# Patient Record
Sex: Male | Born: 1950 | Race: White | Hispanic: No | Marital: Single | Smoking: Light tobacco smoker
Health system: Southern US, Community
[De-identification: ages and names within clinical notes are randomized; demographics above are authoritative.]

## PROBLEM LIST (undated history)

## (undated) DIAGNOSIS — I10 Essential (primary) hypertension: Secondary | ICD-10-CM

## (undated) DIAGNOSIS — J811 Chronic pulmonary edema: Secondary | ICD-10-CM

## (undated) DIAGNOSIS — I6509 Occlusion and stenosis of unspecified vertebral artery: Secondary | ICD-10-CM

## (undated) DIAGNOSIS — E7800 Pure hypercholesterolemia, unspecified: Secondary | ICD-10-CM

## (undated) DIAGNOSIS — S060XAA Concussion with loss of consciousness status unknown, initial encounter: Secondary | ICD-10-CM

## (undated) DIAGNOSIS — M1990 Unspecified osteoarthritis, unspecified site: Secondary | ICD-10-CM

## (undated) DIAGNOSIS — S060X9A Concussion with loss of consciousness of unspecified duration, initial encounter: Secondary | ICD-10-CM

## (undated) HISTORY — PX: INCISION AND DRAINAGE OF WOUND: SHX1803

## (undated) HISTORY — PX: TONSILLECTOMY: SUR1361

## (undated) HISTORY — DX: Essential (primary) hypertension: I10

## (undated) HISTORY — DX: Unspecified osteoarthritis, unspecified site: M19.90

---

## 2005-08-09 HISTORY — PX: KNEE ARTHROSCOPY: SUR90

## 2010-03-27 ENCOUNTER — Emergency Department (HOSPITAL_BASED_OUTPATIENT_CLINIC_OR_DEPARTMENT_OTHER): Admission: EM | Admit: 2010-03-27 | Discharge: 2010-03-27 | Payer: Self-pay | Admitting: Emergency Medicine

## 2011-11-09 ENCOUNTER — Encounter: Payer: Self-pay | Admitting: Family Medicine

## 2011-11-09 ENCOUNTER — Ambulatory Visit (INDEPENDENT_AMBULATORY_CARE_PROVIDER_SITE_OTHER): Payer: 59 | Admitting: Family Medicine

## 2011-11-09 VITALS — BP 196/91 | HR 80 | Temp 98.1°F | Ht 74.0 in | Wt 248.0 lb

## 2011-11-09 DIAGNOSIS — K4090 Unilateral inguinal hernia, without obstruction or gangrene, not specified as recurrent: Secondary | ICD-10-CM

## 2011-11-09 DIAGNOSIS — K4020 Bilateral inguinal hernia, without obstruction or gangrene, not specified as recurrent: Secondary | ICD-10-CM | POA: Insufficient documentation

## 2011-11-09 NOTE — Progress Notes (Signed)
  Subjective:    Patient ID: Arthur Hines, male    DOB: 1951-03-25, 61 y.o.   MRN: [id number]  PCP: None listed  HPI 61 yo M here for low abdominal pain.  Patient states 3 months ago during a martial arts test he pulled something in groin area while doing knee thrusts. Was able to continue in martial arts but with pain. Since then has continued to have nagging intermittent pain in this area worse with physical activity. Pain radiates into right testicle. Associated with bulge he's noticed in abdominal wall. Seen a couple different physicians and a chiropractor - told he had a groin strain and another stated it was coming from his back. Has been taking xiflamed and resting the area (previously was doing home exercise program). Not doing any exercises currently. Notes he has white coat hypertension and is being followed by his PCP (did not list this on his sheet who the PCP was). No GI symptoms.  Past Medical History  Diagnosis Date  . Hypertension     No current outpatient prescriptions on file prior to visit.    History reviewed. No pertinent past surgical history.  No Known Allergies  History   Social History  . Marital Status: Single    Spouse Name: N/A    Number of Children: N/A  . Years of Education: N/A   Occupational History  . Not on file.   Social History Main Topics  . Smoking status: Current Some Day Smoker    Types: Pipe  . Smokeless tobacco: Not on file   Comment: smokes a pipe occasionally  . Alcohol Use: Not on file  . Drug Use: Not on file  . Sexually Active: Not on file   Other Topics Concern  . Not on file   Social History Narrative  . No narrative on file    Family History  Problem Relation Age of Onset  . Hypertension Mother   . Diabetes Neg Hx   . Heart attack Neg Hx   . Hyperlipidemia Neg Hx   . Sudden death Neg Hx     BP 196/91  Pulse 80  Temp(Src) 98.1 F (36.7 C) (Oral)  Ht 6\' 2"  (1.88 m)  Wt 248 lb (112.492 kg)  BMI  31.84 kg/m2  Review of Systems See HPI above.    Objective:   Physical Exam Gen: NAD  Abd/GU: Soft, nontender, nondistended. Palpable defect right abdominal wall with valsalva consistent with location of direct inguinal hernia. Hernia also felt in inguinal canal on right, not on left. No other abnormalities.    Assessment & Plan:  1. Right direct inguinal hernia - possible indirect component as well.  Persistent pain impacting his ability to exercise.  Will refer to general surgeon to further discuss treatment, operative intervention, recovery time, etc.

## 2011-11-09 NOTE — Assessment & Plan Note (Signed)
possible indirect component as well.  Persistent pain impacting his ability to exercise.  Will refer to general surgeon to further discuss treatment, operative intervention, recovery time, etc.

## 2011-11-24 ENCOUNTER — Ambulatory Visit (INDEPENDENT_AMBULATORY_CARE_PROVIDER_SITE_OTHER): Payer: 59 | Admitting: Surgery

## 2011-11-24 ENCOUNTER — Encounter (INDEPENDENT_AMBULATORY_CARE_PROVIDER_SITE_OTHER): Payer: Self-pay | Admitting: Surgery

## 2011-11-24 VITALS — BP 188/92 | HR 84 | Temp 98.4°F | Resp 16 | Ht 73.0 in | Wt 249.0 lb

## 2011-11-24 DIAGNOSIS — K4090 Unilateral inguinal hernia, without obstruction or gangrene, not specified as recurrent: Secondary | ICD-10-CM

## 2011-11-24 NOTE — Progress Notes (Signed)
Subjective:     Patient ID: Arthur Hines, male   DOB: [birth year], 61 y.o.   MRN: [id number]  HPI  Arthur Hines  1951/05/05 [id number]  Patient Care Team: Provider Not In System as PCP - General Lenda Kelp, MD as Attending Physician (Family Medicine)  This patient is a 61 y.o.male who presents today for surgical evaluation at the request of Dr.Hudnall.   Reason for visit: Right groin swelling and pain. Probable inguinal hernia.  Patient is an active male. Does martial arts. Walks daily. He is originally from [name]. He works for [employer]. He comes to the [place] region for periods of time.  On 15December after a working on an intense martial arts workout, he felt right groin pain. Initial evaluations in [place] and here was consistent with a groin strain. However, he's noticed a bulge in the past month. He was concerned. Dr. Pearletha Forge was concerned about an inguinal hernia. Surgery consultation was requested.  No history of skin infections. Rather active but now has to reduce to low impact activity.  He wants to get back to a more intense physical regimen to keep his weight down. No history of prior hernia or abdominal repairs/surgeries. No urinary problems. Mild low back pain.  His chiropractor helps take care of that. He is working to keep his weight down. He is trying to figure it were to establish his primary care physician. He's plan to get a colonoscopy this year.  Patient Active Problem List  Diagnoses  . Right inguinal hernia    Past Medical History  Diagnosis Date  . Hypertension   . Arthritis     Past Surgical History  Procedure Date  . Meniscus repair 2007    left knee    History   Social History  . Marital Status: Single    Spouse Name: N/A    Number of Children: N/A  . Years of Education: N/A   Occupational History  . Not on file.   Social History Main Topics  . Smoking status: Former Smoker    Types: Pipe  . Smokeless tobacco: Not on  file   Comment: smokes a pipe occasionally  . Alcohol Use: Yes  . Drug Use: No  . Sexually Active: Not on file   Other Topics Concern  . Not on file   Social History Narrative  . No narrative on file    Family History  Problem Relation Age of Onset  . Hypertension Mother   . Diabetes Neg Hx   . Heart attack Neg Hx   . Hyperlipidemia Neg Hx   . Sudden death Neg Hx     Current Outpatient Prescriptions  Medication Sig Dispense Refill  . Cholecalciferol (VITAMIN D3 PO) Take by mouth daily.      . fish oil-omega-3 fatty acids 1000 MG capsule Take 2 g by mouth daily.         No Known Allergies  BP 188/92  Pulse 84  Temp(Src) 98.4 F (36.9 C) (Temporal)  Resp 16  Ht 6\' 1"  (1.854 m)  Wt 249 lb (112.946 kg)  BMI 32.85 kg/m2     Review of Systems  Constitutional: Negative for fever, chills and diaphoresis.  HENT: Negative for nosebleeds, sore throat, facial swelling, mouth sores, trouble swallowing and ear discharge.   Eyes: Negative for photophobia, discharge and visual disturbance.  Respiratory: Negative for choking, chest tightness, shortness of breath and stridor.   Cardiovascular: Negative for chest pain and palpitations.  Patient walks 60 minutes for about 1.5 miles daily without difficulty.  No exertional chest/neck/shoulder/arm pain.   Gastrointestinal: Negative for nausea, vomiting, abdominal pain, diarrhea, constipation, blood in stool, abdominal distention, anal bleeding and rectal pain.  Genitourinary: Negative for dysuria, urgency, difficulty urinating and testicular pain.  Musculoskeletal: Positive for back pain. Negative for myalgias, arthralgias and gait problem.  Skin: Negative for color change, pallor, rash and wound.  Neurological: Negative for dizziness, speech difficulty, weakness, numbness and headaches.  Hematological: Negative for adenopathy. Does not bruise/bleed easily.  Psychiatric/Behavioral: Negative for hallucinations, confusion and  agitation.       Objective:   Physical Exam  Constitutional: He is oriented to person, place, and time. He appears well-developed and well-nourished. No distress.  HENT:  Head: Normocephalic.  Mouth/Throat: Oropharynx is clear and moist. No oropharyngeal exudate.  Eyes: Conjunctivae and EOM are normal. Pupils are equal, round, and reactive to light. No scleral icterus.  Neck: Normal range of motion. Neck supple. No tracheal deviation present.  Cardiovascular: Normal rate, regular rhythm and intact distal pulses.   Pulmonary/Chest: Effort normal and breath sounds normal. No respiratory distress.  Abdominal: Soft. He exhibits no distension and no mass. There is no tenderness. There is no guarding. A hernia is present. Hernia confirmed positive in the right inguinal area. Hernia confirmed negative in the left inguinal area.  Genitourinary:     Musculoskeletal: Normal range of motion. He exhibits no tenderness.  Lymphadenopathy:    He has no cervical adenopathy.       Right: No inguinal adenopathy present.       Left: No inguinal adenopathy present.  Neurological: He is alert and oriented to person, place, and time. No cranial nerve deficit. He exhibits normal muscle tone. Coordination normal.  Skin: Skin is warm and dry. No rash noted. He is not diaphoretic. No erythema. No pallor.  Psychiatric: He has a normal mood and affect. His behavior is normal. Judgment and thought content normal.       Assessment:     RIH      Plan:     So far, he has only evidence of hernia on the right side. If I find one on the left, I will fix it as well, especially given his intense physical activity. There is no evidence on physical exam today.  The anatomy & physiology of the abdominal wall and pelvic floor was discussed.  The pathophysiology of hernias in the inguinal and pelvic region was discussed.  Natural history risks such as progressive enlargement, pain, incarceration & strangulation was  discussed.   Contributors to complications such as smoking, obesity, diabetes, prior surgery, etc were discussed.    I feel the risks of no intervention will lead to serious problems that outweigh the operative risks; therefore, I recommended surgery to reduce and repair the hernia.  I explained laparoscopic techniques with possible need for an open approach.  I noted usual use of mesh to patch and/or buttress hernia repair  Risks such as bleeding, infection, abscess, need for further treatment, heart attack, death, and other risks were discussed.  I noted a good likelihood this will help address the problem.   Goals of post-operative recovery were discussed as well.  Possibility that this will not correct all symptoms was explained.  I stressed the importance of low-impact activity, aggressive pain control, avoiding constipation, & not pushing through pain to minimize risk of post-operative chronic pain or injury. Possibility of reherniation was discussed.  We will work  to minimize complications.     An educational handout further explaining the pathology & treatment options was given as well.  Questions were answered.  The patient expresses understanding & wishes to proceed with surgery.

## 2011-11-24 NOTE — Patient Instructions (Signed)
Hernia Repair with Laparoscope A hernia occurs when an internal organ pushes out through a weak spot in the belly (abdominal) wall muscles. Hernias most commonly occur in the groin and around the navel. Hernias can also occur through a cut by the surgeon (incision) after an abdominal operation. A hernia may be caused by:  Lifting heavy objects.   Prolonged coughing.   Straining to move your bowels.  Hernias can often be pushed back into place (reduced). Most hernias tend to get worse over time. Problems occur when abdominal contents get stuck in the opening and the blood supply is blocked or impaired (incarcerated hernia). Because of these risks, you require surgery to repair the hernia. Your hernia will be repaired using a laparoscope. Laparoscopic surgery is a type of minimally invasive surgery. It does not involve making a typical surgical cut (incision) in the skin. A laparoscope is a telescope-like rod and lens system. It is usually connected to a video camera and a light source so your caregiver can clearly see the operative area. The instruments are inserted through  to  inch (5 mm or 10 mm) openings in the skin at specific locations. A working and viewing space is created by blowing a small amount of carbon dioxide gas into the abdominal cavity. The abdomen is essentially blown up like a balloon (insufflated). This elevates the abdominal wall above the internal organs like a dome. The carbon dioxide gas is common to the human body and can be absorbed by tissue and removed by the respiratory system. Once the repair is completed, the small incisions will be closed with either stitches (sutures) or staples (just like a paper stapler only this staple holds the skin together). LET YOUR CAREGIVERS KNOW ABOUT:  Allergies.   Medications taken including herbs, eye drops, over the counter medications, and creams.   Use of steroids (by mouth or creams).   Previous problems with anesthetics or  Novocaine.   Possibility of pregnancy, if this applies.   History of blood clots (thrombophlebitis).   History of bleeding or blood problems.   Previous surgery.   Other health problems.  BEFORE THE PROCEDURE  Laparoscopy can be done either in a hospital or out-patient clinic. You may be given a mild sedative to help you relax before the procedure. Once in the operating room, you will be given a general anesthesia to make you sleep (unless you and your caregiver choose a different anesthetic).  AFTER THE PROCEDURE  After the procedure you will be watched in a recovery area. Depending on what type of hernia was repaired, you might be admitted to the hospital or you might go home the same day. With this procedure you may have less pain and scarring. This usually results in a quicker recovery and less risk of infection. HOME CARE INSTRUCTIONS   Bed rest is not required. You may continue your normal activities but avoid heavy lifting (more than 10 pounds) or straining.   Cough gently. If you are a smoker it is best to stop, as even the best hernia repair can break down with the continual strain of coughing.   Avoid driving until given the OK by your surgeon.   There are no dietary restrictions unless given otherwise.   TAKE ALL MEDICATIONS AS DIRECTED.   Only take over-the-counter or prescription medicines for pain, discomfort, or fever as directed by your caregiver.  SEEK MEDICAL CARE IF:   There is increasing abdominal pain or pain in your incisions.  There is more bleeding from incisions, other than minimal spotting.   You feel light headed or faint.   You develop an unexplained fever, chills, and/or an oral temperature above 102 F (38.9 C).   You have redness, swelling, or increasing pain in the wound.   Pus coming from wound.   A foul smell coming from the wound or dressings.  SEEK IMMEDIATE MEDICAL CARE IF:   You develop a rash.   You have difficulty breathing.    You have any allergic problems.  MAKE SURE YOU:   Understand these instructions.   Will watch your condition.   Will get help right away if you are not doing well or get worse.  Document Released: 07/26/2005 Document Revised: 07/15/2011 Document Reviewed: 06/25/2009 [facility] Patient Information [address], [place].  Managing Pain  Pain after surgery or related to activity is often due to strain/injury to muscle, tendon, nerves and/or incisions.  This pain is usually short-term and will improve in a few months.   Many people find it helpful to do the following things TOGETHER to help speed the process of healing and to get back to regular activity more quickly:  1. Avoid heavy physical activity a.  no lifting greater than 20 pounds b. Do not "push through" the pain.  Listen to your body and avoid positions and maneuvers than reproduce the pain c. Walking is okay as tolerated, but go slowly and stop when getting sore.  d. Remember: If it hurts to do it, then don't do it! 2. Take Anti-inflammatory medication  a. Take with food/snack around the clock for 1-2 weeks i. This helps the muscle and nerve tissues become less irritable and calm down faster b. Choose ONE of the following over-the-counter medications: i. Naproxen 220mg  tabs (ex. Aleve) 1-2 pills twice a day  ii. Ibuprofen 200mg  tabs (ex. Advil, Motrin) 3-4 pills with every meal and just before bedtime iii. Acetaminophen 500mg  tabs (Tylenol) 1-2 pills with every meal and just before bedtime 3. Use a Heating pad or Ice/Cold Pack a. 4-6 times a day b. May use warm bath/hottub  or showers 4. Try Gentle Massage and/or Stretching  a. at the area of pain many times a day b. stop if you feel pain - do not overdo it  Try these steps together to help you body heal faster and avoid making things get worse.  Doing just one of these things may not be enough.    If you are not getting better after two weeks or are noticing you are  getting worse, contact our office for further advice; we may need to re-evaluate you & see what other things we can do to help.

## 2011-11-25 ENCOUNTER — Encounter (INDEPENDENT_AMBULATORY_CARE_PROVIDER_SITE_OTHER): Payer: Self-pay

## 2011-11-26 ENCOUNTER — Ambulatory Visit (INDEPENDENT_AMBULATORY_CARE_PROVIDER_SITE_OTHER): Payer: 59 | Admitting: Surgery

## 2011-12-13 DIAGNOSIS — K4020 Bilateral inguinal hernia, without obstruction or gangrene, not specified as recurrent: Secondary | ICD-10-CM

## 2011-12-13 HISTORY — PX: INGUINAL HERNIA REPAIR: SUR1180

## 2011-12-27 ENCOUNTER — Ambulatory Visit (INDEPENDENT_AMBULATORY_CARE_PROVIDER_SITE_OTHER): Payer: 59 | Admitting: Surgery

## 2011-12-27 ENCOUNTER — Encounter (INDEPENDENT_AMBULATORY_CARE_PROVIDER_SITE_OTHER): Payer: Self-pay | Admitting: Surgery

## 2011-12-27 VITALS — BP 196/100 | HR 88 | Temp 98.0°F | Resp 16 | Ht 74.0 in | Wt 252.4 lb

## 2011-12-27 DIAGNOSIS — K4020 Bilateral inguinal hernia, without obstruction or gangrene, not specified as recurrent: Secondary | ICD-10-CM

## 2011-12-27 NOTE — Progress Notes (Signed)
Subjective:     Patient ID: Arthur Hines, male   DOB: 12-19-1950, 61 y.o.   MRN: [id number]  HPI  Tracker Mance  01/28/1951 [id number]  Patient Care Team: Provider Not In System as PCP - General Lenda Kelp, MD as Attending Physician (Family Medicine)  This patient is a 61 y.o.male who presents today for surgical evaluation.    Procedure: Laparoscopic bilateral inguinal hernia repairs  Reason for visit: Followup  The patient comes in today doing pretty well. We weaned himself off the narcotics around postoperative day #10. Using ibuprofen twice a day. Urinating fine. Moving his bowels well.  He had a moderate amount of the bruising and swelling. Those are gradually improving although he feels the spermatic cord rather full. Some sensitivity at the incisions, but it is mild. He is back to walking and being more active. Hoping to go back to work on light duty soon. No fevers or chills.  Patient Active Problem List  Diagnoses  . Bilateral inguinal hernia (BIH)    Past Medical History  Diagnosis Date  . Hypertension   . Arthritis     Past Surgical History  Procedure Date  . Meniscus repair 2007    left knee  . Hernia repair 12/13/2011    RIH    History   Social History  . Marital Status: Single    Spouse Name: N/A    Number of Children: N/A  . Years of Education: N/A   Occupational History  . Not on file.   Social History Main Topics  . Smoking status: Former Smoker    Types: Pipe  . Smokeless tobacco: Not on file   Comment: smokes a pipe occasionally  . Alcohol Use: Yes  . Drug Use: No  . Sexually Active: Not on file   Other Topics Concern  . Not on file   Social History Narrative  . No narrative on file    Family History  Problem Relation Age of Onset  . Hypertension Mother   . Diabetes Neg Hx   . Heart attack Neg Hx   . Hyperlipidemia Neg Hx   . Sudden death Neg Hx     Current Outpatient Prescriptions  Medication Sig Dispense Refill  .  Cholecalciferol (VITAMIN D3 PO) Take by mouth daily.      . fish oil-omega-3 fatty acids 1000 MG capsule Take 2 g by mouth daily.         No Known Allergies  BP 196/100  Pulse 88  Temp(Src) 98 F (36.7 C) (Temporal)  Resp 16  Ht 6\' 2"  (1.88 m)  Wt 252 lb 6.4 oz (114.488 kg)  BMI 32.41 kg/m2  No results found.   Review of Systems  Constitutional: Negative for fever, chills and diaphoresis.  HENT: Negative for sore throat, trouble swallowing and neck pain.   Eyes: Negative for photophobia and visual disturbance.  Respiratory: Negative for choking and shortness of breath.   Cardiovascular: Negative for chest pain and palpitations.  Gastrointestinal: Negative for nausea, vomiting, abdominal distention, anal bleeding and rectal pain.  Genitourinary: Negative for dysuria, urgency, difficulty urinating and testicular pain.  Musculoskeletal: Negative for myalgias, arthralgias and gait problem.  Skin: Negative for color change and rash.  Neurological: Negative for dizziness, speech difficulty, weakness and numbness.  Hematological: Negative for adenopathy.  Psychiatric/Behavioral: Negative for hallucinations, confusion and agitation.       Objective:   Physical Exam  Constitutional: He is oriented to person, place, and time. He appears well-developed  and well-nourished. No distress.  HENT:  Head: Normocephalic.  Mouth/Throat: Oropharynx is clear and moist. No oropharyngeal exudate.  Eyes: Conjunctivae and EOM are normal. Pupils are equal, round, and reactive to light. No scleral icterus.  Neck: Normal range of motion. No tracheal deviation present.  Cardiovascular: Normal rate, normal heart sounds and intact distal pulses.   Pulmonary/Chest: Effort normal. No respiratory distress.  Abdominal: Soft. He exhibits no distension. There is no tenderness. Hernia confirmed negative in the right inguinal area and confirmed negative in the left inguinal area.       Incisions clean with  normal healing ridges.  No hernias  Genitourinary:       Mod thickening of spermatic cords but viable.  Scrotum without major hematoma  Musculoskeletal: Normal range of motion. He exhibits no tenderness.  Neurological: He is alert and oriented to person, place, and time. No cranial nerve deficit. He exhibits normal muscle tone. Coordination normal.  Skin: Skin is warm and dry. No rash noted. He is not diaphoretic.  Psychiatric: He has a normal mood and affect. His behavior is normal.       Assessment:     2 weeks s/p lap BIH repairs, recovering    Plan:     Increase activity as tolerated.  Do not push through pain.  Advanced on diet as tolerated. Bowel regimen to avoid problems.  Okay to start light duty for a few weeks. Gradually transition to unrestricted activity over the next month  Return to clinic p.r.n. The patient expressed understanding and appreciation

## 2012-09-09 DIAGNOSIS — I6509 Occlusion and stenosis of unspecified vertebral artery: Secondary | ICD-10-CM

## 2012-09-09 HISTORY — DX: Occlusion and stenosis of unspecified vertebral artery: I65.09

## 2012-09-29 ENCOUNTER — Encounter (HOSPITAL_COMMUNITY): Payer: Self-pay | Admitting: Emergency Medicine

## 2012-09-29 ENCOUNTER — Inpatient Hospital Stay (HOSPITAL_COMMUNITY)
Admission: EM | Admit: 2012-09-29 | Discharge: 2012-09-30 | DRG: 149 | Disposition: A | Discharge status: Home / Self Care | Payer: 59 | Attending: Internal Medicine | Admitting: Internal Medicine

## 2012-09-29 ENCOUNTER — Emergency Department (HOSPITAL_COMMUNITY): Payer: 59

## 2012-09-29 ENCOUNTER — Inpatient Hospital Stay (HOSPITAL_COMMUNITY): Payer: 59

## 2012-09-29 DIAGNOSIS — I2510 Atherosclerotic heart disease of native coronary artery without angina pectoris: Secondary | ICD-10-CM | POA: Diagnosis present

## 2012-09-29 DIAGNOSIS — G459 Transient cerebral ischemic attack, unspecified: Secondary | ICD-10-CM

## 2012-09-29 DIAGNOSIS — R42 Dizziness and giddiness: Principal | ICD-10-CM | POA: Diagnosis present

## 2012-09-29 DIAGNOSIS — R0989 Other specified symptoms and signs involving the circulatory and respiratory systems: Secondary | ICD-10-CM

## 2012-09-29 DIAGNOSIS — I658 Occlusion and stenosis of other precerebral arteries: Secondary | ICD-10-CM | POA: Diagnosis present

## 2012-09-29 DIAGNOSIS — Z79899 Other long term (current) drug therapy: Secondary | ICD-10-CM

## 2012-09-29 DIAGNOSIS — R0789 Other chest pain: Secondary | ICD-10-CM

## 2012-09-29 DIAGNOSIS — Z7982 Long term (current) use of aspirin: Secondary | ICD-10-CM

## 2012-09-29 DIAGNOSIS — I509 Heart failure, unspecified: Secondary | ICD-10-CM | POA: Diagnosis present

## 2012-09-29 DIAGNOSIS — M129 Arthropathy, unspecified: Secondary | ICD-10-CM | POA: Diagnosis present

## 2012-09-29 DIAGNOSIS — I5030 Unspecified diastolic (congestive) heart failure: Secondary | ICD-10-CM | POA: Diagnosis present

## 2012-09-29 DIAGNOSIS — I6529 Occlusion and stenosis of unspecified carotid artery: Secondary | ICD-10-CM | POA: Diagnosis present

## 2012-09-29 DIAGNOSIS — I10 Essential (primary) hypertension: Secondary | ICD-10-CM | POA: Diagnosis present

## 2012-09-29 DIAGNOSIS — F17200 Nicotine dependence, unspecified, uncomplicated: Secondary | ICD-10-CM | POA: Diagnosis present

## 2012-09-29 LAB — CREATININE, SERUM: GFR calc Af Amer: >90 mL/min (ref >90)

## 2012-09-29 LAB — CBC
Platelets: 171 10*3/uL (ref 150–400)
RBC: 4.65 MIL/uL (ref 4.22–5.81)
WBC: 8.6 10*3/uL (ref 4.0–10.5)

## 2012-09-29 LAB — TROPONIN I: Troponin I: <0.3 ng/mL (ref <0.30)

## 2012-09-29 LAB — BASIC METABOLIC PANEL
CO2: 24 mEq/L (ref 19–32)
Calcium: 8.9 mg/dL (ref 8.4–10.5)
Chloride: 102 mEq/L (ref 96–112)
Sodium: 138 mEq/L (ref 135–145)

## 2012-09-29 LAB — GLUCOSE, CAPILLARY

## 2012-09-29 LAB — RAPID URINE DRUG SCREEN, HOSP PERFORMED
Barbiturates: NOT DETECTED
Cocaine: NOT DETECTED
Tetrahydrocannabinol: NOT DETECTED

## 2012-09-29 MED ORDER — ATENOLOL 50 MG PO TABS
50.0000 mg | ORAL_TABLET | Freq: Every day | ORAL | Status: DC
  Administered 2012-09-29 – 2012-09-30 (×2): 50 mg via ORAL
  Filled 2012-09-29 (×2): qty 1

## 2012-09-29 MED ORDER — LISINOPRIL 10 MG PO TABS
30.0000 mg | ORAL_TABLET | Freq: Every evening | ORAL | Status: DC
Start: 2012-09-29 — End: 2012-09-30
  Administered 2012-09-29: 30 mg via ORAL
  Filled 2012-09-29 (×2): qty 1

## 2012-09-29 MED ORDER — GADOBENATE DIMEGLUMINE 529 MG/ML IV SOLN
20.0000 mL | Freq: Once | INTRAVENOUS | Status: AC
  Administered 2012-09-29: 20 mL via INTRAVENOUS

## 2012-09-29 MED ORDER — CLOPIDOGREL BISULFATE 75 MG PO TABS
75.0000 mg | ORAL_TABLET | Freq: Every day | ORAL | Status: DC
Start: 2012-09-30 — End: 2012-09-30
  Administered 2012-09-30: 75 mg via ORAL
  Filled 2012-09-29: qty 1

## 2012-09-29 MED ORDER — OMEGA-3-ACID ETHYL ESTERS 1 G PO CAPS
2.0000 g | ORAL_CAPSULE | Freq: Every day | ORAL | Status: DC
  Administered 2012-09-29 – 2012-09-30 (×2): 2 g via ORAL
  Filled 2012-09-29 (×2): qty 2

## 2012-09-29 MED ORDER — ASPIRIN 81 MG PO CHEW
324.0000 mg | CHEWABLE_TABLET | Freq: Once | ORAL | Status: AC
  Administered 2012-09-29: 324 mg via ORAL
  Filled 2012-09-29: qty 4

## 2012-09-29 MED ORDER — OMEGA-3 FATTY ACIDS 1000 MG PO CAPS: 2.0000 g | ORAL_CAPSULE | Freq: Every morning | ORAL | Status: DC

## 2012-09-29 MED ORDER — ENOXAPARIN SODIUM 40 MG/0.4ML ~~LOC~~ SOLN
40.0000 mg | Freq: Every day | SUBCUTANEOUS | Status: DC
  Administered 2012-09-29: 40 mg via SUBCUTANEOUS
  Filled 2012-09-29 (×2): qty 0.4

## 2012-09-29 MED ORDER — ASPIRIN EC 81 MG PO TBEC
81.0000 mg | DELAYED_RELEASE_TABLET | Freq: Every evening | ORAL | Status: DC
  Filled 2012-09-29: qty 1

## 2012-09-29 MED ORDER — HYDRALAZINE HCL 20 MG/ML IJ SOLN: 5.0000 mg | Freq: Four times a day (QID) | INTRAMUSCULAR | Status: DC | PRN

## 2012-09-29 NOTE — Progress Notes (Signed)
  Echocardiogram 2D Echocardiogram has been performed.  Jorje Guild 09/29/2012, 9:46 AM

## 2012-09-29 NOTE — Progress Notes (Signed)
*  PRELIMINARY RESULTS* Vascular Ultrasound Carotid Duplex (Doppler) has been completed.  Preliminary findings: Right:  40-59% internal carotid artery stenosis.  Left:  No evidence of hemodynamically significant internal carotid artery stenosis.   Antegrade vertebral flow.    Farrel Demark, RDMS, RVT  09/29/2012, 3:38 PM

## 2012-09-29 NOTE — Evaluation (Signed)
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  He has been exercising regularly with walking and doing Yoga with no exertional chest pain.  He has no shortness of breath, nausea, vomiting and diaphoresis.  He has "dizziness" with turning his head to the right.  He just has not been able to describe whether it is vertiginous or lightheadedness with his complaint of dizziness.  Evaluation in the ER showed normal CXR, unremarkable EKG, and negative troponin.  Hospitalist was asked to admit him for further work up.   Assessment / Plan / Recommendation Clinical Impression  Pt with normal communication/cognition/speech; no dysphagia.  MRI negative.  No SLP f/u warranted.  May benefit from vestibular assessment per PT/OT.    SLP Assessment  Patient does not need any further Speech Language Pathology Services     Arthur Hines Arthur Hines 09/29/2012, 1:00 PM

## 2012-09-29 NOTE — Progress Notes (Signed)
Utilization review completed.  

## 2012-09-29 NOTE — ED Notes (Signed)
MD at bedside. 

## 2012-09-29 NOTE — H&P (Signed)
[facility] History and Physical  Arthur Hines ZOX:[id number] DOB: 12/26/1950    PCP:   NONE  Chief Complaint: dizziness and chest pain.  HPI: Arthur Hines is an 62 y.o. 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  He has been exercising regularly with walking and doing Yoga with no exertional chest pain.  He has no shortness of breath, nausea, vomiting and diaphoresis.  He has "dizziness" with turning his head to the right.  He just has not been able to describe whether it is vertiginous or lightheadedness with his complaint of dizziness.  Evaluation in the ER showed normal CXR, unremarkable EKG, and negative troponin.  Hospitalist was asked to admit him for further work up.  Rewiew of Systems:  Constitutional: Negative for malaise, fever and chills. No significant weight loss or weight gain Eyes: Negative for eye pain, redness and discharge, diplopia, visual changes, or flashes of light. ENMT: Negative for ear pain, hoarseness, nasal congestion, sinus pressure and sore throat. No headaches; tinnitus, drooling, or problem swallowing. Cardiovascular: Negative for chest pain, palpitations, diaphoresis, dyspnea and peripheral edema. ; No orthopnea, PND Respiratory: Negative for cough, hemoptysis, wheezing and stridor. No pleuritic chestpain. Gastrointestinal: Negative for nausea, vomiting, diarrhea, constipation, abdominal pain, melena, blood in stool, hematemesis, jaundice and rectal bleeding.    Genitourinary: Negative for frequency, dysuria, incontinence,flank pain and hematuria; Musculoskeletal: Negative for back pain and neck pain. Negative for swelling and trauma.;  Skin: . Negative for pruritus, rash, abrasions, bruising and skin lesion.; ulcerations Neuro: Negative for stiffness. Negative for weakness, altered level of consciousness , altered mental status, extremity weakness,  burning feet, involuntary movement, seizure and syncope.  Psych: negative for anxiety, depression, insomnia, tearfulness, panic attacks, hallucinations, paranoia, suicidal or homicidal ideation    Past Medical History  Diagnosis Date  . Hypertension   . Arthritis     Past Surgical History  Procedure Laterality Date  . Meniscus repair  2007    left knee  . Hernia repair  12/13/2011    [facility]  . Hand surgery      Medications:  HOME MEDS: Prior to Admission medications   Medication Sig Start Date End Date Taking? Authorizing Provider  aspirin EC 81 MG tablet Take 81 mg by mouth every evening.   Yes Historical Provider, MD  CHLOROPHYLL PO Take 1 tablet by mouth every morning.   Yes Historical Provider, MD  Cholecalciferol (VITAMIN D3 PO) Take 1 capsule by mouth every morning.    Yes Historical Provider, MD  fish oil-omega-3 fatty acids 1000 MG capsule Take 2 g by mouth every morning.    Yes Historical Provider, MD  lisinopril (PRINIVIL,ZESTRIL) 30 MG tablet Take 30 mg by mouth every evening.    Yes Historical Provider, MD  MAGNESIUM-POTASSIUM PO Take 1 tablet by mouth every morning.   Yes Historical Provider, MD  Multiple Vitamin (MULTIVITAMIN WITH MINERALS) TABS Take 1 tablet by mouth every morning.   Yes Historical Provider, MD  OVER THE COUNTER MEDICATION Take 1 capsule by mouth every morning. Over the counter homeopathic medication-Bioplasm   Yes Historical Provider, MD  SPIRULINA PO Take 1 tablet by mouth every morning.   Yes Historical Provider, MD     Allergies:  No Known Allergies  Social History:   reports that he has been smoking Pipe.  He does not have any smokeless tobacco history on file. He reports that  drinks alcohol. He reports that he does not use illicit drugs.  Family History: Family History  Problem Relation Age of Onset  . Hypertension Mother   . Diabetes Neg Hx   . Heart attack Neg Hx   . Hyperlipidemia Neg Hx   . Sudden death Neg Hx      Physical  Exam: There were no vitals filed for this visit. There were no vitals taken for this visit.  GEN:  Pleasant  patient lying in the stretcher in no acute distress; cooperative with exam. PSYCH:  alert and oriented x4; does not appear anxious or depressed; affect is appropriate. HEENT: Mucous membranes pink and anicteric; PERRLA; EOM intact; no cervical lymphadenopathy nor thyromegaly there is a right carotid bruit.  no JVD; There were no stridor. Neck is very supple.  Baldemar Friday was negative. Breasts:: Not examined CHEST WALL: No tenderness CHEST: Normal respiration, clear to auscultation bilaterally.  HEART: Regular rate and rhythm.  There are no murmur, rub, or gallops.   BACK: No kyphosis or scoliosis; no CVA tenderness ABDOMEN: soft and non-tender; no masses, no organomegaly, normal abdominal bowel sounds; no pannus; no intertriginous candida. There is no rebound and no distention. Rectal Exam: Not done EXTREMITIES: No bone or joint deformity; age-appropriate arthropathy of the hands and knees; no edema; no ulcerations.  There is no calf tenderness. Genitalia: not examined PULSES: 2+ and symmetric SKIN: Normal hydration no rash or ulceration CNS: Cranial nerves 2-12 grossly intact no focal lateralizing neurologic deficit.  Speech is fluent; uvula elevated with phonation, facial symmetry and tongue midline. DTR are normal bilaterally, cerebella exam is intact, barbinski is negative and strengths are equaled bilaterally.  No sensory loss.   Labs on Admission:  Basic Metabolic Panel:  Recent Labs Lab 09/29/12 0154  NA 138  K 4.0  CL 102  CO2 24  GLUCOSE 107*  BUN 17  CREATININE 0.95  CALCIUM 8.9   Liver Function Tests: No results found for this basename: AST, ALT, ALKPHOS, BILITOT, PROT, ALBUMIN,  in the last 168 hours No results found for this basename: LIPASE, AMYLASE,  in the last 168 hours No results found for this basename: AMMONIA,  in the last 168 hours CBC:  Recent  Labs Lab 09/29/12 0154  WBC 8.6  HGB 13.8  HCT 42.9  MCV 92.3  PLT 171   Cardiac Enzymes:  Recent Labs Lab 09/29/12 0154  TROPONINI <0.30    CBG: No results found for this basename: GLUCAP,  in the last 168 hours   Radiological Exams on Admission: Dg Chest 2 View  09/29/2012  *RADIOLOGY REPORT*  Clinical Data: Chest pain, hypertension.  CHEST - 2 VIEW  Comparison: None.  Findings: Aortic atherosclerosis.  Otherwise, cardiomediastinal contours within normal range.  No pleural effusion or pneumothorax. No confluent airspace opacity.  No acute osseous finding. Sequelae of prior posterior right rib fracture.  IMPRESSION: No radiographic evidence of acute cardiopulmonary process.   Original Report Authenticated By: Jearld Lesch, M.D.     EKG: Independently reviewed. NSR with no acute ST T changes.   Assessment/Plan Present on Admission:  . Dizziness, nonspecific . Right carotid bruit . HTN (hypertension), benign  PLAN:  I can't get a good grasp on whether or not his symptom is vertiginous or lightheadedness.  His Hall-Pike maneuver didn't elicit any vertigo nor nystagmus.  His chest pain is atypical and I don't think this is ACS.  He does have a right carotid bruit and has some risks for  CVA and cardiovascular disease.   I will admit him for TIA/CVA work up along with cardiac r/out for him.   Will transfer him to [facility]  I will get an MRI of the brain and MRA of the carotid with cardiac ECHO.  Will add Atenelol to his BP meds along with an ASA.  He is stable, full code, and will be admitted to [facility] service.    Other plans as per orders.  Code Status: FULL Unk Lightning, MD. [facility] Pager [phone] 7pm to 7am.  09/29/2012, 4:50 AM

## 2012-09-29 NOTE — Evaluation (Signed)
Occupational Therapy Evaluation Patient Details Name: Arthur Hines MRN: [id number] DOB: Aug 27, 1950 Today's Date: 09/29/2012 Time: 1441-1500 OT Time Calculation (min): 19 min  OT Assessment / Plan / Recommendation Clinical Impression  62 yo male admitted due to dizziness during hot yoga and CP within the last month. Ot to follow acutely for vestibular needs. Recommend Outpatient follow up    OT Assessment  Patient needs continued OT Services    Follow Up Recommendations  Outpatient OT    Barriers to Discharge      Equipment Recommendations  None recommended by OT    Recommendations for Other Services    Frequency  Other (comment) (2-3 TREATMENTS)    Precautions / Restrictions Precautions Precaution Comments: incr fall due to dizziness   Pertinent Vitals/Pain     ADL  Eating/Feeding: Independent Where Assessed - Eating/Feeding: Edge of bed Grooming: Wash/dry hands;Independent Where Assessed - Grooming: Unsupported standing Lower Body Dressing: Independent Where Assessed - Lower Body Dressing: Unsupported sitting Toilet Transfer: Independent Toilet Transfer Method: Sit to stand Toilet Transfer Equipment: Regular height toilet Equipment Used: Gait belt Transfers/Ambulation Related to ADLs: pt completed EOB to sink level grooming task.  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 Pt visiting doctor to check for wax in ears due to reporting the need to frequency hold nose and attempt to relieve pressue in ears. Pt reports feeling pressure in ears bil. Pt using ear cleaing wax solution in left ear.  Pt verbalized having dizziness rolling from stomach to back in bed at night, waking himself up with dizziness due to bed mobility, brief periods of dizziness that resolve quickly, dizziness happening with rt head turns (reaching for alarm clock). Pt provided epley maneuver Rt. Pt with no reaction supine<>Sit with  bed in reverse tredelenburg. Pt with positive dizziness with head rotated Rt for ~10-15 seconds. pt held in position addition 30 seconds. No nystagmus noted. Pt completed epley without addition dizzines. Each position held for 30 seconds. Pt educated on leaving [facility] incr for x4 hours 30 degrees. Pt completed sink level adl without deficits. Ot to follow to reassess vestibular s/p epley treatment. PT notified of vestibular needs Pt demonstrates 180 degree turn in a pivot to toss paper towel in waste basket. No dizziness.    OT Diagnosis: Generalized weakness  OT Problem List: Impaired balance (sitting and/or standing);Decreased coordination OT Treatment Interventions: Therapeutic exercise;Self-care/ADL training;Balance training;Patient/family education   OT Goals Acute Rehab OT Goals OT Goal Formulation: With patient Time For Goal Achievement: 10/13/12 Potential to Achieve Goals: Good Miscellaneous OT Goals Miscellaneous OT Goal #1: Pt will demonstrate gaze stabilization with bed mobility at Independent level with no dizziness OT Goal: Miscellaneous Goal #1 - Progress: Goal set today  Visit Information  Last OT Received On: 09/29/12 Assistance Needed: +1    Subjective Data  Subjective: "I think we did some good"- pt response to treatment Patient Stated Goal: to return to doing guitar, yoga, riding motorcycle, visiting lady friends   Prior Functioning     Home Living Lives With: Alone Bathroom Shower/Tub: Walk-in shower;Door Dentist: None Prior Function Level of Independence: Independent Able to Take Stairs?: Yes Driving: Yes Vocation: Full time employment 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[employer]: No difficulties Dominant Hand: Right         Vision/Perception Vision - History Baseline Vision: Wears glasses all the time Patient Visual Report: No  change from baseline   Cognition  Cognition Overall  Cognitive Status: Appears within functional limits for tasks assessed/performed Arousal/Alertness: Awake/alert Orientation Level: Appears intact for tasks assessed Behavior During Session: [facility] for tasks performed    Extremity/Trunk Assessment Right Upper Extremity Assessment RUE ROM/Strength/Tone: Within functional levels RUE Sensation: WFL - Light Touch RUE Coordination: WFL - gross/fine motor Left Upper Extremity Assessment LUE ROM/Strength/Tone: Within functional levels LUE Sensation: WFL - Light Touch LUE Coordination: WFL - gross/fine motor Trunk Assessment Trunk Assessment: Normal     Mobility Bed Mobility Bed Mobility: Supine to Sit;Sitting - Scoot to Edge of Bed;Sit to Supine Supine to Sit: 7: Independent Sitting - Scoot to Edge of Bed: 7: Independent Sit to Supine: 7: Independent Transfers Transfers: Sit to Stand;Stand to Sit Sit to Stand: 7: Independent Stand to Sit: 7: Independent     Exercise     Balance     End of Session OT - End of Session Activity Tolerance: Patient tolerated treatment well Patient left: in bed;with call bell/phone within reach;Other (comment) (HOB locked at 30 degrees) Nurse Communication: Mobility status  GO     Harrel Carina [employer] 09/29/2012, 4:37 PM Pager: [phone]

## 2012-09-29 NOTE — Progress Notes (Signed)
[facility] PROGRESS NOTE  Arthur Hines ION:[id number] DOB: 1951/03/30 DOA: 09/29/2012 PCP: Provider Not In System  Assessment/Plan: 1. Dizziness: further work up for TIA was done including MRI of the brain, which was normal, MRA of the head and neck revealed bilateral vertebral artery stenosis at the point of origins up to 70%. He was also found to have 40% stenosis of the right ICA which would explain his bruit. Spoke to Dr Amada Jupiter over the phone informally and decided medical management at this time with Plavix 75  Mg daily. He was on baby aspirin at home. His Hgba1c is 5.8.  2. Atypical chest pain; resolved. His cardiac enzymes are  Negative. Echo showed good LVEF and has grade 2 diastolic dysfunction. Continue with baby aspirin and added plavix for extensive atherosclerotic disease.  3. Hypertension: not well controlled. On lisinopril, atenolol and on prn hydralazine.  4. Diastolic Heart failure: he appears to be compensated at this time.  5. DVT prophylxis: on lovenox.  Code Status: full code Family Communication: none at bedside Disposition Plan: possibly tomorrow.     HPI/Subjective: Dizziness improved. No chest pain.   Objective: Filed Vitals:   09/29/12 0619 09/29/12 0622 09/29/12 0800 09/29/12 1200  BP: 144/69 144/69 172/83 193/80  Pulse: 80 82 81 77  Temp:  98.2 F (36.8 C) 98.1 F (36.7 C) 98.4 F (36.9 C)  TempSrc:  Oral Oral Oral  Resp: 18 16 18 18   Height:   6\' 2"  (1.88 m)   Weight:   107.502 kg (237 lb)   SpO2: 99% 99% 98% 98%    Intake/Output Summary (Last 24 hours) at 09/29/12 1614 Last data filed at 09/29/12 0900  Gross per 24 hour  Intake      0 ml  Output    200 ml  Net   -200 ml   Filed Weights   09/29/12 0800  Weight: 107.502 kg (237 lb)    Exam: Alert afebrile comfortable.  CHEST: Normal respiration, clear to auscultation bilaterally.  HEART: Regular rate and rhythm. There are no murmur, rub, or gallops.  BACK: No kyphosis or  scoliosis; no CVA tenderness  ABDOMEN: soft and non-tender; no masses, no organomegaly, normal abdominal bowel sounds; no pannus; no intertriginous candida. There is no rebound and no distention.  Rectal Exam: Not done  EXTREMITIES: No bone or joint deformity; age-appropriate arthropathy of the hands and knees; no edema; no ulcerations. There is no calf tenderness Neuro: no focal deficits.     Data Reviewed: Basic Metabolic Panel:  Recent Labs Lab 09/29/12 0154 09/29/12 0605  NA 138  --   K 4.0  --   CL 102  --   CO2 24  --   GLUCOSE 107*  --   BUN 17  --   CREATININE 0.95 0.92  CALCIUM 8.9  --    Liver Function Tests: No results found for this basename: AST, ALT, ALKPHOS, BILITOT, PROT, ALBUMIN,  in the last 168 hours No results found for this basename: LIPASE, AMYLASE,  in the last 168 hours No results found for this basename: AMMONIA,  in the last 168 hours CBC:  Recent Labs Lab 09/29/12 0154  WBC 8.6  HGB 13.8  HCT 42.9  MCV 92.3  PLT 171   Cardiac Enzymes:  Recent Labs Lab 09/29/12 0154 09/29/12 0605  TROPONINI <0.30 <0.30   BNP (last 3 results) No results found for this basename: PROBNP,  in the last 8760 hours CBG: No results found for this  basename: GLUCAP,  in the last 168 hours  No results found for this or any previous visit (from the past 240 hour(s)).   Studies: Dg Chest 2 View  09/29/2012  *RADIOLOGY REPORT*  Clinical Data: Chest pain, hypertension.  CHEST - 2 VIEW  Comparison: None.  Findings: Aortic atherosclerosis.  Otherwise, cardiomediastinal contours within normal range.  No pleural effusion or pneumothorax. No confluent airspace opacity.  No acute osseous finding. Sequelae of prior posterior right rib fracture.  IMPRESSION: No radiographic evidence of acute cardiopulmonary process.   Original Report Authenticated By: Jearld Lesch, M.D.    Mr Angiogram Neck W Wo Contrast  09/29/2012  *RADIOLOGY REPORT*  Clinical Data:  Dizziness.   Carotid bruit.  MRI HEAD WITHOUT AND WITH CONTRAST MRA HEAD WITHOUT CONTRAST MRA NECK WITHOUT AND WITH CONTRAST  Technique:  Multiplanar, multiecho pulse sequences of the brain and surrounding structures were obtained without and with intravenous contrast.  Angiographic images of the Circle of Willis were obtained using MRA technique without intravenous contrast. Angiographic images of the neck were obtained using MRA technique without and with intravenous contrast.  Carotid stenosis measurements (when applicable) are obtained utilizing NASCET criteria, using the distal internal carotid diameter as the denominator.  Contrast: 20mL MULTIHANCE GADOBENATE DIMEGLUMINE 529 MG/ML IV SOLN  Comparison:   None.  MRI HEAD  Findings:  The brain has a normal appearance without evidence of old or acute infarction, mass lesion, hemorrhage, hydrocephalus or extra-axial collection.  No pituitary mass.  No fluid in the sinuses, middle ears or mastoids.  No skull or skull base lesion.  IMPRESSION: Normal MRI of the brain.  MRA HEAD  Findings: Both internal carotid arteries are widely patent into the brain.  The anterior and middle cerebral vessels are normal without proximal stenosis, aneurysm or vascular malformation.  Both vertebral arteries are patent to the basilar with the right being dominant.  No basilar stenosis.  Posterior circulation branch vessels appear unremarkable.  IMPRESSION: Normal intracranial MR angiography.  MRA NECK  Findings: Branching pattern of the brachiocephalic vessels from the arch is normal.  No origin stenoses.  Both common carotid arteries are widely patent.  At the left carotid bifurcation, there is a focal plaque along the posterior wall of the ICA bulb.  Minimal luminal diameter is 5 mm, the same as the more distal cervical ICA and therefore there is no stenosis.  Atherosclerotic disease at the carotid bifurcation on the right results in narrowing of the distal common carotid artery to a diameter of  4 mm.  Narrowing of the proximal ICA shows a minimal diameter of 3 mm and some irregularity.  Compared to a more distal cervical ICA diameter of 5 mm, this indicates a 40% stenosis.  There is stenosis at the right vertebral artery origin estimated at 50-70%.  Beyond that, the vessel is widely patent to the basilar. There is stenosis at the left vertebral artery estimated at 70-90%. The vessel is a small vessel with serial areas of narrowing.  IMPRESSION: Atherosclerotic disease of both carotid bifurcations. Irregularities could serve as a source of emboli.  Maximal stenosis of the proximal ICA on the right is 40%.  There is no measurable stenosis of the ICA on the left.  Stenoses of both vertebral artery origins, 50-70% on the right and 70% or greater on the left.  The left vertebral artery is a small vessel with the appearance of multiple serial stenoses.   Original Report Authenticated By: Paulina Fusi, M.D.  Mr Laqueta Jean ZO Contrast  09/29/2012  *RADIOLOGY REPORT*  Clinical Data:  Dizziness.  Carotid bruit.  MRI HEAD WITHOUT AND WITH CONTRAST MRA HEAD WITHOUT CONTRAST MRA NECK WITHOUT AND WITH CONTRAST  Technique:  Multiplanar, multiecho pulse sequences of the brain and surrounding structures were obtained without and with intravenous contrast.  Angiographic images of the Circle of Willis were obtained using MRA technique without intravenous contrast. Angiographic images of the neck were obtained using MRA technique without and with intravenous contrast.  Carotid stenosis measurements (when applicable) are obtained utilizing NASCET criteria, using the distal internal carotid diameter as the denominator.  Contrast: 20mL MULTIHANCE GADOBENATE DIMEGLUMINE 529 MG/ML IV SOLN  Comparison:   None.  MRI HEAD  Findings:  The brain has a normal appearance without evidence of old or acute infarction, mass lesion, hemorrhage, hydrocephalus or extra-axial collection.  No pituitary mass.  No fluid in the sinuses, middle ears  or mastoids.  No skull or skull base lesion.  IMPRESSION: Normal MRI of the brain.  MRA HEAD  Findings: Both internal carotid arteries are widely patent into the brain.  The anterior and middle cerebral vessels are normal without proximal stenosis, aneurysm or vascular malformation.  Both vertebral arteries are patent to the basilar with the right being dominant.  No basilar stenosis.  Posterior circulation branch vessels appear unremarkable.  IMPRESSION: Normal intracranial MR angiography.  MRA NECK  Findings: Branching pattern of the brachiocephalic vessels from the arch is normal.  No origin stenoses.  Both common carotid arteries are widely patent.  At the left carotid bifurcation, there is a focal plaque along the posterior wall of the ICA bulb.  Minimal luminal diameter is 5 mm, the same as the more distal cervical ICA and therefore there is no stenosis.  Atherosclerotic disease at the carotid bifurcation on the right results in narrowing of the distal common carotid artery to a diameter of 4 mm.  Narrowing of the proximal ICA shows a minimal diameter of 3 mm and some irregularity.  Compared to a more distal cervical ICA diameter of 5 mm, this indicates a 40% stenosis.  There is stenosis at the right vertebral artery origin estimated at 50-70%.  Beyond that, the vessel is widely patent to the basilar. There is stenosis at the left vertebral artery estimated at 70-90%. The vessel is a small vessel with serial areas of narrowing.  IMPRESSION: Atherosclerotic disease of both carotid bifurcations. Irregularities could serve as a source of emboli.  Maximal stenosis of the proximal ICA on the right is 40%.  There is no measurable stenosis of the ICA on the left.  Stenoses of both vertebral artery origins, 50-70% on the right and 70% or greater on the left.  The left vertebral artery is a small vessel with the appearance of multiple serial stenoses.   Original Report Authenticated By: Paulina Fusi, M.D.    Mr Mra  Head/brain Wo Cm  09/29/2012  *RADIOLOGY REPORT*  Clinical Data:  Dizziness.  Carotid bruit.  MRI HEAD WITHOUT AND WITH CONTRAST MRA HEAD WITHOUT CONTRAST MRA NECK WITHOUT AND WITH CONTRAST  Technique:  Multiplanar, multiecho pulse sequences of the brain and surrounding structures were obtained without and with intravenous contrast.  Angiographic images of the Circle of Willis were obtained using MRA technique without intravenous contrast. Angiographic images of the neck were obtained using MRA technique without and with intravenous contrast.  Carotid stenosis measurements (when applicable) are obtained utilizing NASCET criteria, using the distal internal carotid diameter  as the denominator.  Contrast: 20mL MULTIHANCE GADOBENATE DIMEGLUMINE 529 MG/ML IV SOLN  Comparison:   None.  MRI HEAD  Findings:  The brain has a normal appearance without evidence of old or acute infarction, mass lesion, hemorrhage, hydrocephalus or extra-axial collection.  No pituitary mass.  No fluid in the sinuses, middle ears or mastoids.  No skull or skull base lesion.  IMPRESSION: Normal MRI of the brain.  MRA HEAD  Findings: Both internal carotid arteries are widely patent into the brain.  The anterior and middle cerebral vessels are normal without proximal stenosis, aneurysm or vascular malformation.  Both vertebral arteries are patent to the basilar with the right being dominant.  No basilar stenosis.  Posterior circulation branch vessels appear unremarkable.  IMPRESSION: Normal intracranial MR angiography.  MRA NECK  Findings: Branching pattern of the brachiocephalic vessels from the arch is normal.  No origin stenoses.  Both common carotid arteries are widely patent.  At the left carotid bifurcation, there is a focal plaque along the posterior wall of the ICA bulb.  Minimal luminal diameter is 5 mm, the same as the more distal cervical ICA and therefore there is no stenosis.  Atherosclerotic disease at the carotid bifurcation on the  right results in narrowing of the distal common carotid artery to a diameter of 4 mm.  Narrowing of the proximal ICA shows a minimal diameter of 3 mm and some irregularity.  Compared to a more distal cervical ICA diameter of 5 mm, this indicates a 40% stenosis.  There is stenosis at the right vertebral artery origin estimated at 50-70%.  Beyond that, the vessel is widely patent to the basilar. There is stenosis at the left vertebral artery estimated at 70-90%. The vessel is a small vessel with serial areas of narrowing.  IMPRESSION: Atherosclerotic disease of both carotid bifurcations. Irregularities could serve as a source of emboli.  Maximal stenosis of the proximal ICA on the right is 40%.  There is no measurable stenosis of the ICA on the left.  Stenoses of both vertebral artery origins, 50-70% on the right and 70% or greater on the left.  The left vertebral artery is a small vessel with the appearance of multiple serial stenoses.   Original Report Authenticated By: Paulina Fusi, M.D.     Scheduled Meds: . atenolol  50 mg Oral Daily  . [START ON 09/30/2012] clopidogrel  75 mg Oral Q breakfast  . enoxaparin (LOVENOX) injection  40 mg Subcutaneous Daily  . lisinopril  30 mg Oral QPM  . omega-3 acid ethyl esters  2 g Oral Daily   Continuous Infusions:   Principal Problem:   Atypical chest pain Active Problems:   Dizziness, nonspecific   Right carotid bruit   HTN (hypertension), benign        Anthonette Lesage  [facility] Pager [phone]. If 8PM-8AM, please contact night-coverage at [email or web address], password [facility] 09/29/2012, 4:14 PM  LOS: 0 days

## 2012-09-29 NOTE — ED Provider Notes (Signed)
History     CSN: [id number]  Arrival date & time 09/29/12  0142   First MD Initiated Contact with Patient 09/29/12 0211      Chief Complaint  Patient presents with  . Hypertension  . Chest Pain    (Consider location/radiation/quality/duration/timing/severity/associated sxs/prior treatment) HPI Arthur Hines is a 62 y.o. male presenting with chest pain, arm pain, dizziness and high blood pressure.  Pt has a h/o HTN and only recently has been treated by his company physician at [employer] where he works on [employer].  Pt presents with 2-3 days of achy pain in the left side of the chest that is non-reproducible, doesn't radiate, isn't associated with diaphoresis, nausea, vomiting, or dizziness.  The pain is intermittent, "doesn't last long."  It occurs at rest and when active - being active may relieve it.  He's also had some left arm pain in the mid-upper arm on the medial side that "feels like a grab on your brachial."  He notes his arm pain is not radiating pain and is different from his chest pain and occurs at different times.  He noted his BP has been high over the past 2 days and is taking 30mg  lisinopril (fairly recent addition of lisinopril and escalation of dose.)  Pt also notes dizziness, first noted when he bent over in yoga about a month ago, then more recently he notices it when he moves his head back and to the right.  He has difficulty describing sensation of movement vs lightheadedness, however, denies nausea, or difficulty walking with this sensation.  He says it occasionally happens when he turns in bed, but hasn't happened when waking in the morning.  He is active, does yoga and exercises regularly.  Quit smoking 1978, 10 pack year hist, no HLD.  Mother had atrial fibrillation and a pacemaker, no known CAD/stents, heart surgery and died 3 years ago.  Father had multiple CVA and died of brain cancer.  Double inguinal hernia surgery last May.  Past Medical History  Diagnosis Date   . Hypertension   . Arthritis     Past Surgical History  Procedure Laterality Date  . Meniscus repair  2007    left knee  . Hernia repair  12/13/2011    RIH  . Hand surgery      Family History  Problem Relation Age of Onset  . Hypertension Mother   . Diabetes Neg Hx   . Heart attack Neg Hx   . Hyperlipidemia Neg Hx   . Sudden death Neg Hx     History  Substance Use Topics  . Smoking status: Light Tobacco Smoker    Types: Pipe  . Smokeless tobacco: Not on file     Comment: smokes a pipe occasionally  . Alcohol Use: Yes      Review of Systems  Allergies  Review of patient's allergies indicates no known allergies.  Home Medications   Current Outpatient Rx  Name  Route  Sig  Dispense  Refill  . lisinopril (PRINIVIL,ZESTRIL) 30 MG tablet   Oral   Take 30 mg by mouth daily.         . Cholecalciferol (VITAMIN D3 PO)   Oral   Take by mouth daily.         . fish oil-omega-3 fatty acids 1000 MG capsule   Oral   Take 2 g by mouth daily.           There were no vitals taken for this visit.  Physical Exam  Nursing notes reviewed.  Electronic medical record reviewed. VITAL SIGNS:  There were no vitals filed for this visit. CONSTITUTIONAL: Awake, oriented, appears non-toxic HENT: Atraumatic, normocephalic, oral mucosa pink and moist, airway patent. Nares patent without drainage. External ears normal. EYES: Conjunctiva clear, EOMI, PERRLA NECK: Trachea midline, non-tender, supple. Bruit over bifurcation on Right CARDIOVASCULAR: Normal heart rate, Normal rhythm, No murmurs, rubs, gallops PULMONARY/CHEST: Clear to auscultation, no rhonchi, wheezes, or rales. Symmetrical breath sounds. Non-tender. ABDOMINAL: Non-distended, soft, non-tender - no rebound or guarding.  BS normal. NEUROLOGIC: Non-focal, moving all four extremities, no gross sensory or motor deficits. EXTREMITIES: No clubbing, cyanosis. 1+ pitting edema bilaterally SKIN: Warm, Dry, No erythema, No  rash  ED Course  Procedures (including critical care time)  Date: 09/29/2012  Rate: 94  Rhythm: normal sinus rhythm  QRS Axis: normal  Intervals: normal  ST/T Wave abnormalities: normal  Conduction Disutrbances: none  Narrative Interpretation: No ST or T wave abnormalities consistent with ischemia or infarction, possible left atrial enlargement  I personally reviewed the imaging tests through PACS system; I reviewed available ER/hospitalization records through the EMR Labs Reviewed  BASIC METABOLIC PANEL - Abnormal; Notable for the following:    Glucose, Bld 107 (*)    GFR calc non Af Amer 87 (*)    All other components within normal limits  HEMOGLOBIN A1C - Abnormal; Notable for the following:    Hemoglobin A1C 5.8 (*)    Mean Plasma Glucose 120 (*)    All other components within normal limits  CREATININE, SERUM - Abnormal; Notable for the following:    GFR calc non Af Amer 89 (*)    All other components within normal limits  CBC  TROPONIN I  URINE RAPID DRUG SCREEN (HOSP PERFORMED)  TROPONIN I  TROPONIN I  TROPONIN I  LIPID PANEL   Dg Chest 2 View  09/29/2012  *RADIOLOGY REPORT*  Clinical Data: Chest pain, hypertension.  CHEST - 2 VIEW  Comparison: None.  Findings: Aortic atherosclerosis.  Otherwise, cardiomediastinal contours within normal range.  No pleural effusion or pneumothorax. No confluent airspace opacity.  No acute osseous finding. Sequelae of prior posterior right rib fracture.  IMPRESSION: No radiographic evidence of acute cardiopulmonary process.   Original Report Authenticated By: Jearld Lesch, M.D.      1. Dizziness, nonspecific   2. Atypical chest pain   3. HTN (hypertension), benign   4. Right carotid bruit       MDM  Arthur Hines is a 62 y.o. male presenting with multiple complaints.  His h/o CP is atypical and my suspicion for ACS is low.  Pt has some edema, but handles physical activity well, so I also doubt CHF.  No neurologic deficits -  but pt has a right carotid bruit.  Doubt life threatening intrathoracic pathology at this time including acute coronary syndrome, pericarditis, aortic dissection, pulmonary embolism, tension pneumothorax, pneumonia, and esophageal rupture.  Labs are reassuring, EKG is unremarkable.  CXR is unremarkable by my interpretation and by radiologist read.  PT history is concerning for dizziness - FamHx of CVA and pt has a bruit.   Pt has no PCP and limited follow up.  I think it's in the patient's best interest to admit to the hospital for further workup.  D/W Dr. Conley Rolls for admission.  PT to transfer to [facility] main campus.  D/w pt.               Jones Skene, MD 09/29/12  1151 

## 2012-09-29 NOTE — ED Notes (Signed)
Pt states the other day he was doing hot yoga and became dizzy and he went down to one knee  Pt states certain ways he moves his head he gets dizzy  Pt states for the past 3 days or so he has had a dull aching in his chest and he has noticed it in his left upper arm as well  Pt states he has noticed his blood pressure has been elevated lately  Pt states he has not been on his blood pressure medication for a long time as it has been controlled with diet and exercise until about 3 weeks ago when he was started back on his lisinopril at 10mg  and has increased it gradually and is now on 30mg  daily

## 2012-09-29 NOTE — ED Notes (Signed)
Pt. Denies any chest pain at this time. Ambulatory, went to bathroom twice without any distress reported/noted.

## 2012-09-29 NOTE — ED Notes (Signed)
Pt. Claimed of having nasal congestion at times and "ringing on ears"

## 2012-09-29 NOTE — ED Notes (Addendum)
Report given to receiving Nurse Lindsey,RN . Called [facility] for transport. Pt. Signed consent for transfer. In bed waiting for [facility].Denies any chest pain .

## 2012-09-30 LAB — LIPID PANEL
HDL: 46 mg/dL (ref >39)
LDL Cholesterol: 117 mg/dL — ABNORMAL HIGH (ref 0–99)
Total CHOL/HDL Ratio: 4 RATIO

## 2012-09-30 LAB — GLUCOSE, CAPILLARY: Glucose-Capillary: 91 mg/dL (ref 70–99)

## 2012-09-30 MED ORDER — SIMVASTATIN 20 MG PO TABS
20.0000 mg | ORAL_TABLET | Freq: Every day | ORAL | Status: DC
  Filled 2012-09-30: qty 1

## 2012-09-30 MED ORDER — LISINOPRIL 30 MG PO TABS: 30.0000 mg | ORAL_TABLET | Freq: Every evening | ORAL | Status: DC

## 2012-09-30 MED ORDER — ATENOLOL 50 MG PO TABS: 50.0000 mg | ORAL_TABLET | Freq: Every day | ORAL | Status: DC

## 2012-09-30 MED ORDER — CLOPIDOGREL BISULFATE 75 MG PO TABS: 75.0000 mg | ORAL_TABLET | Freq: Every day | ORAL | Status: AC

## 2012-09-30 MED ORDER — SIMVASTATIN 20 MG PO TABS: 20.0000 mg | ORAL_TABLET | Freq: Every day | ORAL | Status: DC

## 2012-09-30 NOTE — Evaluation (Signed)
Physical Therapy Evaluation and D/C Patient Details Name: Arthur Hines MRN: [id number] DOB: 04/08/1951 Today's Date: 09/30/2012 Time: 4098-1191 PT Time Calculation (min): 26 min  PT Assessment / Plan / Recommendation Clinical Impression  Pt s/p episode of BPPV completely resolved after treatment that OT performed yesterday.  No further PT/OT needs.  Pt educated on f/u if symptoms return.  Will sign off.    PT Assessment  Patent does not need any further PT services    Follow Up Recommendations  No PT follow up                Equipment Recommendations  None recommended by PT               Precautions / Restrictions Precautions Precautions: None Restrictions Weight Bearing Restrictions: No   Pertinent Vitals/Pain VSS, No pain      Mobility  Bed Mobility Bed Mobility: Supine to Sit;Sitting - Scoot to Edge of Bed Supine to Sit: 7: Independent Sitting - Scoot to Edge of Bed: 7: Independent Sit to Supine: 7: Independent Transfers Transfers: Sit to Stand;Stand to Sit Sit to Stand: 7: Independent Stand to Sit: 7: Independent Details for Transfer Assistance: Pt able to demonstrate a yoga pose - downward dog which is what caused his dizziness before admission.  No dizziness.  Pt reports complete resolution after treatment yesterday.   Gave pt handouts re: BPPV explanation, Epley explanation, and Austin Miles exercise (only to complete if dizziness returns.).   Ambulation/Gait Ambulation/Gait Assistance: 7: Independent Ambulation Distance (Feet): 200 Feet Assistive device: None Gait Pattern: Within Functional Limits Stairs: No Wheelchair Mobility Wheelchair Mobility: No    Exercises Other Exercises Other Exercises: Educated in [place] exercise if symptoms return.    PT Goals  N/A  Visit Information  Last PT Received On: 09/30/12 Assistance Needed: +1    Subjective Data  Subjective: "I am great." Patient Stated Goal: Go home   Prior Functioning  Home Living Lives With: Alone Available Help at Discharge: Friend(s) Type of Home: Apartment Home Access: Stairs to enter [employer] of Steps: flight Entrance Stairs-Rails: Right Home Layout: Two level Bathroom Shower/Tub: Walk-in shower;Door Dentist: None Prior Function Level of Independence: Independent Able to Take Stairs?: Yes Driving: Yes Vocation: Full time employment Communication Communication: No difficulties Dominant Hand: Right    Cognition  Cognition Overall Cognitive Status: Appears within functional limits for tasks assessed/performed Arousal/Alertness: Awake/alert Orientation Level: Appears intact for tasks assessed Behavior During Session: [facility] for tasks performed    Extremity/Trunk Assessment Right Lower Extremity Assessment RLE ROM/Strength/Tone: Within functional levels Left Lower Extremity Assessment LLE ROM/Strength/Tone: Within functional levels Trunk Assessment Trunk Assessment: Normal   Balance Standardized Balance Assessment Standardized Balance Assessment: Dynamic Gait Index Dynamic Gait Index Level Surface: Normal Change in Gait Speed: Normal Gait with Horizontal Head Turns: Normal Gait with Vertical Head Turns: Normal Gait and Pivot Turn: Normal Step Over Obstacle: Normal Step Around Obstacles: Normal Steps: Normal Total Score: 24  End of Session PT - End of Session Activity Tolerance: Patient tolerated treatment well Patient left: in chair;with call bell/phone within reach Nurse Communication: Mobility status       INGOLD,Nyasia Baxley 09/30/2012, 8:25 AM  [facility] [phone] (pager)

## 2012-09-30 NOTE — Progress Notes (Signed)
Pt doing very well this am.  Very eager to go home. Non-pitting edema in bilateral ankles is no longer present today.  Pt states that he works 12 hour shifts and at end of day has mild edema in feet/ankles, but after sleeping this goes away.  No c/o dizziness this am.  No CP.

## 2012-09-30 NOTE — Progress Notes (Signed)
NCM contacted pt at home to provide him with information on how to locate a PCP. Explained he could call the toll free number on his insurance card and the customer service rep could mail out a provider list. Also provided pt with [facility] and Physician referral services number. Pt states he will follow up with on Monday to locate a PCP in this area. Isidoro Donning RN CCM Case Mgmt phone [phone]

## 2012-10-01 NOTE — Discharge Summary (Signed)
Physician Discharge Summary  Arthur Hines ZOX:[id number] DOB: 06/22/1951 DOA: 09/29/2012  PCP: Provider Not In System  Admit date: 09/29/2012 Discharge date: 10/01/2012   Recommendations for Outpatient Follow-up:  1. Recommend setting up an appointment with PCP and possibel referral to cardiology for outpatient stress test.  2.  patient refused to take statin for his hyperlipidemia, because of the side effects. Recommended life style changes . And check lipid panel in 3 months.   Discharge Diagnoses:  Principal Problem:   Atypical chest pain Active Problems:   Dizziness, nonspecific   Right carotid bruit   HTN (hypertension), benign   Discharge Condition: stable.   Diet recommendation: stable  Filed Weights   09/29/12 0800 09/30/12 0500  Weight: 107.502 kg (237 lb) 107.366 kg (236 lb 11.2 oz)    History of present illness:  Arthur Hines is an 62 y.o. 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 He has been exercising regularly with walking and doing Yoga with no exertional chest pain. He has no shortness of breath, nausea, vomiting and diaphoresis. He has "dizziness" with turning his head to the right. He just has not been able to describe whether it is vertiginous or lightheadedness with his complaint of dizziness. Evaluation in the ER showed normal CXR, unremarkable EKG, and negative troponin. Hospitalist was asked to admit him for further work up.   Hospital Course:   Dizziness/ Vertigo: resolved.  When he was admitted  further work up for TIA was done including MRI of the brain, which was normal, MRA of the head and neck revealed bilateral vertebral artery stenosis at the point of origins up to 70%. He was also found to have 40% stenosis of the right ICA which would explain his bruit. Spoke to Dr Amada Jupiter over the phone informally and decided medical management at this  time with Plavix 75 Mg daily. He was on baby aspirin at home. His Hgba1c is 5.8.  Atypical chest pain; resolved. His cardiac enzymes are Negative. Echo showed good LVEF and has grade 2 diastolic dysfunction. He was ordered plavix for extensive atherosclerotic disease.  Hypertension: better controlled On lisinopril, atenolol and . 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  HEART: Regular rate and rhythm. There are no murmur, rub, or gallops.  BACK: No kyphosis or scoliosis; no CVA tenderness  ABDOMEN: soft and non-tender; no masses, no organomegaly, normal abdominal bowel sounds; no pannus; no intertriginous candida. There is no rebound and no distention.  Rectal Exam: Not done  EXTREMITIES: No bone or joint deformity; age-appropriate arthropathy of the hands and knees; no edema; no ulcerations. There is no calf tenderness  Neuro: no focal deficits.    Discharge Instructions  Discharge Orders   Future Orders Complete By Expires     Diet - low sodium heart healthy  As directed     Discharge instructions  As directed     Comments:      Follow up with PCP and cardiology referral for outpatient stress test.        Medication List    STOP taking these medications       aspirin EC 81 MG tablet      TAKE these medications       atenolol 50 MG tablet  Commonly known as:  TENORMIN  Take 1 tablet (50 mg total) by mouth daily.     CHLOROPHYLL PO  Take 1 tablet by mouth every morning.     clopidogrel 75 MG tablet  Commonly known as:  PLAVIX  Take 1 tablet (75 mg total)  by mouth daily with breakfast.     fish oil-omega-3 fatty acids 1000 MG capsule  Take 2 g by mouth every morning.     lisinopril 30 MG tablet  Commonly known as:  PRINIVIL,ZESTRIL  Take 1 tablet (30 mg total) by mouth every evening.     MAGNESIUM-POTASSIUM PO  Take 1 tablet by mouth every morning.     multivitamin with minerals Tabs  Take 1 tablet by mouth every morning.     OVER THE COUNTER MEDICATION  Take 1 capsule by mouth every morning. Over the counter homeopathic medication-Bioplasm     simvastatin 20 MG tablet  Commonly known as:  ZOCOR  Take 1 tablet (20 mg total) by mouth daily at 6 PM.     SPIRULINA PO  Take 1 tablet by mouth every morning.     VITAMIN D3 PO  Take 1 capsule by mouth every morning.           Follow-up Information   Follow up with Provider Not In System. Schedule an appointment as soon as possible for a visit in 1 week.       The results of significant diagnostics from this hospitalization (including imaging, microbiology, ancillary and laboratory) are listed below for reference.    Significant Diagnostic Studies: Dg Chest 2 View  09/29/2012  *RADIOLOGY REPORT*  Clinical Data: Chest pain, hypertension.  CHEST - 2 VIEW  Comparison: None.  Findings: Aortic atherosclerosis.  Otherwise, cardiomediastinal contours within normal range.  No pleural effusion or pneumothorax. No confluent airspace opacity.  No acute osseous finding. Sequelae of prior posterior right rib fracture.  IMPRESSION: No radiographic evidence of acute cardiopulmonary process.   Original Report Authenticated By: Jearld Lesch, M.D.    Mr Angiogram Neck W Wo Contrast  09/29/2012  *RADIOLOGY REPORT*  Clinical Data:  Dizziness.  Carotid bruit.  MRI HEAD WITHOUT AND WITH CONTRAST MRA HEAD WITHOUT CONTRAST MRA NECK WITHOUT AND WITH CONTRAST  Technique:  Multiplanar, multiecho pulse sequences of the brain and surrounding structures were obtained without and with intravenous contrast.   Angiographic images of the Circle of Willis were obtained using MRA technique without intravenous contrast. Angiographic images of the neck were obtained using MRA technique without and with intravenous contrast.  Carotid stenosis measurements (when applicable) are obtained utilizing NASCET criteria, using the distal internal carotid diameter as the denominator.  Contrast: 20mL MULTIHANCE GADOBENATE DIMEGLUMINE 529 MG/ML IV SOLN  Comparison:   None.  MRI HEAD  Findings:  The brain has a normal appearance without evidence of old or acute infarction, mass lesion, hemorrhage, hydrocephalus or extra-axial collection.  No pituitary mass.  No fluid in the sinuses, middle ears or mastoids.  No skull or skull base lesion.  IMPRESSION: Normal MRI of the brain.  MRA HEAD  Findings: Both internal carotid arteries are widely patent into the brain.  The anterior and middle cerebral vessels are normal without proximal stenosis, aneurysm or vascular malformation.  Both vertebral arteries are patent to the basilar with the right being dominant.  No basilar stenosis.  Posterior circulation branch vessels appear unremarkable.  IMPRESSION: Normal intracranial MR angiography.  MRA NECK  Findings: Branching pattern of the brachiocephalic vessels from the arch is normal.  No origin stenoses.  Both common carotid arteries are widely patent.  At the left carotid bifurcation, there is a focal plaque along the posterior wall of the ICA bulb.  Minimal luminal diameter is 5 mm, the same as the more distal cervical ICA and therefore there is no stenosis.  Atherosclerotic disease at the carotid bifurcation on the right results in narrowing of the distal common carotid artery to a diameter of 4 mm.  Narrowing of the proximal ICA shows a minimal diameter of 3 mm and some irregularity.  Compared to a more distal cervical ICA diameter of 5 mm, this indicates a 40% stenosis.  There is stenosis at the right vertebral artery origin estimated at 50-70%.   Beyond that, the vessel is widely patent to the basilar. There is stenosis at the left vertebral artery estimated at 70-90%. The vessel is a small vessel with serial areas of narrowing.  IMPRESSION: Atherosclerotic disease of both carotid bifurcations. Irregularities could serve as a source of emboli.  Maximal stenosis of the proximal ICA on the right is 40%.  There is no measurable stenosis of the ICA on the left.  Stenoses of both vertebral artery origins, 50-70% on the right and 70% or greater on the left.  The left vertebral artery is a small vessel with the appearance of multiple serial stenoses.   Original Report Authenticated By: Paulina Fusi, M.D.    Mr Laqueta Jean ZO Contrast  09/29/2012  *RADIOLOGY REPORT*  Clinical Data:  Dizziness.  Carotid bruit.  MRI HEAD WITHOUT AND WITH CONTRAST MRA HEAD WITHOUT CONTRAST MRA NECK WITHOUT AND WITH CONTRAST  Technique:  Multiplanar, multiecho pulse sequences of the brain and surrounding structures were obtained without and with intravenous contrast.  Angiographic images of the Circle of Willis were obtained using MRA technique without intravenous contrast. Angiographic images of the neck were obtained using MRA technique without and with intravenous contrast.  Carotid stenosis measurements (when applicable) are obtained utilizing NASCET criteria, using the distal internal carotid diameter as the denominator.  Contrast: 20mL MULTIHANCE GADOBENATE DIMEGLUMINE 529 MG/ML IV SOLN  Comparison:   None.  MRI HEAD  Findings:  The brain has a normal appearance without evidence of old or acute infarction, mass lesion, hemorrhage, hydrocephalus or extra-axial collection.  No pituitary mass.  No fluid in the sinuses, middle ears or mastoids.  No skull or skull base lesion.  IMPRESSION: Normal MRI of the brain.  MRA HEAD  Findings: Both internal carotid arteries are widely patent into the brain.  The anterior and middle cerebral vessels are normal without proximal stenosis, aneurysm  or vascular malformation.  Both vertebral arteries are patent to the basilar with the right being dominant.  No basilar stenosis.  Posterior circulation branch vessels appear unremarkable.  IMPRESSION: Normal intracranial MR angiography.  MRA NECK  Findings: Branching pattern of the brachiocephalic vessels from the arch is normal.  No origin stenoses.  Both common carotid arteries are widely patent.  At the left carotid bifurcation, there is a focal plaque along the posterior wall of the  ICA bulb.  Minimal luminal diameter is 5 mm, the same as the more distal cervical ICA and therefore there is no stenosis.  Atherosclerotic disease at the carotid bifurcation on the right results in narrowing of the distal common carotid artery to a diameter of 4 mm.  Narrowing of the proximal ICA shows a minimal diameter of 3 mm and some irregularity.  Compared to a more distal cervical ICA diameter of 5 mm, this indicates a 40% stenosis.  There is stenosis at the right vertebral artery origin estimated at 50-70%.  Beyond that, the vessel is widely patent to the basilar. There is stenosis at the left vertebral artery estimated at 70-90%. The vessel is a small vessel with serial areas of narrowing.  IMPRESSION: Atherosclerotic disease of both carotid bifurcations. Irregularities could serve as a source of emboli.  Maximal stenosis of the proximal ICA on the right is 40%.  There is no measurable stenosis of the ICA on the left.  Stenoses of both vertebral artery origins, 50-70% on the right and 70% or greater on the left.  The left vertebral artery is a small vessel with the appearance of multiple serial stenoses.   Original Report Authenticated By: Paulina Fusi, M.D.    Mr Mra Head/brain Wo Cm  09/29/2012  *RADIOLOGY REPORT*  Clinical Data:  Dizziness.  Carotid bruit.  MRI HEAD WITHOUT AND WITH CONTRAST MRA HEAD WITHOUT CONTRAST MRA NECK WITHOUT AND WITH CONTRAST  Technique:  Multiplanar, multiecho pulse sequences of the brain and  surrounding structures were obtained without and with intravenous contrast.  Angiographic images of the Circle of Willis were obtained using MRA technique without intravenous contrast. Angiographic images of the neck were obtained using MRA technique without and with intravenous contrast.  Carotid stenosis measurements (when applicable) are obtained utilizing NASCET criteria, using the distal internal carotid diameter as the denominator.  Contrast: 20mL MULTIHANCE GADOBENATE DIMEGLUMINE 529 MG/ML IV SOLN  Comparison:   None.  MRI HEAD  Findings:  The brain has a normal appearance without evidence of old or acute infarction, mass lesion, hemorrhage, hydrocephalus or extra-axial collection.  No pituitary mass.  No fluid in the sinuses, middle ears or mastoids.  No skull or skull base lesion.  IMPRESSION: Normal MRI of the brain.  MRA HEAD  Findings: Both internal carotid arteries are widely patent into the brain.  The anterior and middle cerebral vessels are normal without proximal stenosis, aneurysm or vascular malformation.  Both vertebral arteries are patent to the basilar with the right being dominant.  No basilar stenosis.  Posterior circulation branch vessels appear unremarkable.  IMPRESSION: Normal intracranial MR angiography.  MRA NECK  Findings: Branching pattern of the brachiocephalic vessels from the arch is normal.  No origin stenoses.  Both common carotid arteries are widely patent.  At the left carotid bifurcation, there is a focal plaque along the posterior wall of the ICA bulb.  Minimal luminal diameter is 5 mm, the same as the more distal cervical ICA and therefore there is no stenosis.  Atherosclerotic disease at the carotid bifurcation on the right results in narrowing of the distal common carotid artery to a diameter of 4 mm.  Narrowing of the proximal ICA shows a minimal diameter of 3 mm and some irregularity.  Compared to a more distal cervical ICA diameter of 5 mm, this indicates a 40% stenosis.   There is stenosis at the right vertebral artery origin estimated at 50-70%.  Beyond that, the vessel is widely patent to  the basilar. There is stenosis at the left vertebral artery estimated at 70-90%. The vessel is a small vessel with serial areas of narrowing.  IMPRESSION: Atherosclerotic disease of both carotid bifurcations. Irregularities could serve as a source of emboli.  Maximal stenosis of the proximal ICA on the right is 40%.  There is no measurable stenosis of the ICA on the left.  Stenoses of both vertebral artery origins, 50-70% on the right and 70% or greater on the left.  The left vertebral artery is a small vessel with the appearance of multiple serial stenoses.   Original Report Authenticated By: Paulina Fusi, M.D.     Microbiology: No results found for this or any previous visit (from the past 240 hour(s)).   Labs: Basic Metabolic Panel:  Recent Labs Lab 09/29/12 0154 09/29/12 0605  NA 138  --   K 4.0  --   CL 102  --   CO2 24  --   GLUCOSE 107*  --   BUN 17  --   CREATININE 0.95 0.92  CALCIUM 8.9  --    Liver Function Tests: No results found for this basename: AST, ALT, ALKPHOS, BILITOT, PROT, ALBUMIN,  in the last 168 hours No results found for this basename: LIPASE, AMYLASE,  in the last 168 hours No results found for this basename: AMMONIA,  in the last 168 hours CBC:  Recent Labs Lab 09/29/12 0154  WBC 8.6  HGB 13.8  HCT 42.9  MCV 92.3  PLT 171   Cardiac Enzymes:  Recent Labs Lab 09/29/12 0154 09/29/12 0605  TROPONINI <0.30 <0.30   BNP: BNP (last 3 results) No results found for this basename: PROBNP,  in the last 8760 hours CBG:  Recent Labs Lab 09/29/12 2037 09/30/12 0735 09/30/12 1151  GLUCAP 105* 91 73       Signed:  Farha Dano  [facility] 10/01/2012, 10:38 AM

## 2013-06-14 ENCOUNTER — Other Ambulatory Visit: Payer: Self-pay

## 2014-01-08 ENCOUNTER — Other Ambulatory Visit: Payer: Self-pay | Admitting: Internal Medicine

## 2014-01-08 DIAGNOSIS — I6509 Occlusion and stenosis of unspecified vertebral artery: Secondary | ICD-10-CM

## 2014-01-14 ENCOUNTER — Ambulatory Visit
Admission: RE | Admit: 2014-01-14 | Discharge: 2014-01-14 | Disposition: A | Discharge status: Home / Self Care | Payer: 59 | Source: Ambulatory Visit | Attending: Internal Medicine | Admitting: Internal Medicine

## 2014-01-14 DIAGNOSIS — I6509 Occlusion and stenosis of unspecified vertebral artery: Secondary | ICD-10-CM

## 2014-01-31 ENCOUNTER — Encounter: Payer: Self-pay | Admitting: Cardiology

## 2014-02-07 ENCOUNTER — Other Ambulatory Visit: Payer: Self-pay | Admitting: Cardiology

## 2014-02-07 DIAGNOSIS — I6523 Occlusion and stenosis of bilateral carotid arteries: Secondary | ICD-10-CM

## 2014-02-13 ENCOUNTER — Ambulatory Visit
Admission: RE | Admit: 2014-02-13 | Discharge: 2014-02-13 | Disposition: A | Discharge status: Home / Self Care | Payer: 59 | Source: Ambulatory Visit | Attending: Cardiology | Admitting: Cardiology

## 2014-02-13 DIAGNOSIS — I6523 Occlusion and stenosis of bilateral carotid arteries: Secondary | ICD-10-CM

## 2014-03-19 ENCOUNTER — Other Ambulatory Visit: Payer: Self-pay | Admitting: Cardiology

## 2014-03-19 ENCOUNTER — Encounter (HOSPITAL_COMMUNITY): Payer: Self-pay | Admitting: Pharmacy Technician

## 2014-03-19 NOTE — H&P (Signed)
Arthur Hines, Arthur Hines    Date of visit:  03/19/2014 DOB:  04-Apr-1951    Age:  62 yrs. Medical record number:  [id number]     Account number:  [id number] Primary Care Provider: Ralene OkMOREIRA, ROY ____________________________ CURRENT DIAGNOSES  1. Carotid Artery Stenosis  2. Pre-Op Cardiovascular Exam  3. Hyperlipidemia  4. Hypertension,Essential (Benign)  5. CAD (Native coronary artery)  6. Obesity(BMI30-40)  7. Chronic venous insufficiency ____________________________ ALLERGIES  Crestor, Muscle aches ____________________________ MEDICATIONS  1. clopidogrel 75 mg tablet, 1 p.o. daily  2. hydrochlorothiazide 25 mg tablet, 1 p.o. daily  3. lisinopril 40 mg tablet, 1 p.o. daily  4. Fish Oil 340 mg-1,000 mg capsule, 2   1 qpm  5. multivitamin tablet, 1 p.o. daily  6. vit E-tocotrienol gamma, alpha, delta 50 unit-50 mg (47mg -2mg -1mg ) cap, 2 qd ____________________________ CHIEF COMPLAINTS Preop cardiac eval ____________________________ HISTORY OF PRESENT ILLNESS This 63 year old male is seen for precath evaluation. He was initially seen for opinion regarding management of chronic vascular disease. He has a history of hypertension, obesity and hyperlipidemia. He had some vertigo and dizziness and was admitted to the hospital and was found to have carotid artery disease with a 40% stenosis in the right carotid artery. He remains overweight. He had been recommended to take statin therapy but had not been taking it because he wanted to try natural therapy. He is able to do walking and exercise without angina and has no cardiac symptoms. He has a history of chronic edema due to venous insufficiency. As part of his workup he had a calcium score that was greater than 1400 consistently had a myocardial perfusion scan that showed a fairly large posterolateral ischemic partially reversible defect. He had relatively good exercise tolerance with 2 mm of ST depression at peak exercise. Catheterization is advised to  assess the degree of cardiac stenosis. ____________________________ PAST HISTORY  Past Medical Illnesses:  hypertension, hyperlipidemia, varicose veins, venous insufficiency;  Cardiovascular Illnesses:  CAD, Carotid artery disease;  Infectious Diseases:  no previous history of significant infectious diseases;  Surgical Procedures:  hernia repair, hand surgery;  Trauma History:  no previous history of significant trauma;  NYHA Classification:  I;  Cardiology Procedures-Invasive:  no previous interventional or invasive cardiology procedures;  Cardiology Procedures-Noninvasive:  treadmill Myoview August 2015;  Peripheral Vascular Procedures:  Carotid doppler;  LVEF of 65% documented via nuclear study on 03/14/2014,   ____________________________ FAMILY HISTORY Father -- Father dead, Brain disorder Mother -- Mother dead, Death due to natural cause Sister -- Brother alive and well ____________________________ SOCIAL HISTORY Alcohol Use:  does not use alcohol;  Smoking:  smokes a pipe occasionally;  Diet:  regular diet;  Lifestyle:  divorced and 1 daughter;  Exercise:  exercises regularly;   ____________________________ REVIEW OF SYSTEMS General:  denies recent weight change, fatique or change in exercise tolerance. Eyes: wears eye glasses/contact lenses Ears, Nose, Throat, Mouth:  denies any hearing loss, epistaxis, hoarseness or difficulty speaking. Respiratory: denies dyspnea, cough, wheezing or hemoptysis. Cardiovascular:  please review HPI Abdominal: denies dyspepsia, GI bleeding, constipation, or diarrhea Genitourinary-Male: erectile dysfunction  Musculoskeletal:  chronic varicose veins and some edema Neurological:  peripheral neuropathy Psychiatric:  denies depession or anxiety  ____________________________ PHYSICAL EXAMINATION VITAL SIGNS  Blood Pressure:  152/70 Sitting, Left arm, large cuff  , 148/74 Standing, Left arm and large cuff   Pulse:  70/min. Weight:  226.00 lbs. Height:  72"BMI:  30  Constitutional:  pleasant white male in no acute distress Skin:  warm and dry to touch, no apparent skin lesions, or masses noted. Head:  normocephalic, normal hair pattern, no masses or tenderness Eyes:  EOMS Intact, PERRLA, C and S clear, Funduscopic exam not done. ENT:  ears, nose and throat reveal no gross abnormalities.  Dentition good. Neck:  no JVD, no masses, non-tender, right carotid bruit present Chest:  normal symmetry, clear to auscultation. Cardiac:  regular rhythm, normal S1 and S2, No S3 or S4, no murmurs, gallops or rubs detected. Abdomen:  abdomen soft,non-tender, no masses, no hepatospenomegaly, or aneurysm noted Peripheral Pulses:  the femoral,dorsalis pedis, and posterior tibial pulses are full and equal bilaterally with no bruits auscultated. Extremities & Back:  1+ edema, bilateral venous insufficiency changes present Neurological:  no gross motor or sensory deficits noted, affect appropriate, oriented x3. ____________________________ MOST RECENT LIPID PANEL 03/05/14  CHOL TOTL 236 mg/dl, LDL 161 NM, HDL 50 mg/dl, TRIGLYCER 096 mg/dl and CHOL/HDL 4.7 (Calc) ____________________________ IMPRESSIONS/PLAN  1. Coronary artery disease with fibrous calcium score and moderate risk myocardial perfusion scan 2. Hyperlipidemia was recently started treatment 3. Hypertension 4. Overweight  Recommendations:  Admit for same-day catheterization. Cardiac catheterization was discussed with the patient including risks of myocardial infarction, death, stroke, bleeding, arrhythmia, dye allergy, or renal insufficiency. He understands and is willing to proceed. Possibility of percutaneous intervention at the same setting was also discussed with the patient including risks. ____________________________ TODAYS ORDERS  1. Comprehensive Metabolic Panel: Today  2. Complete Blood Count: Today  3. Draw PT/INR: Today  4. PTT: Today                        ____________________________ Cardiology Physician:  Darden Palmer MD [facility]

## 2014-03-22 ENCOUNTER — Encounter (HOSPITAL_COMMUNITY)
Admission: RE | Disposition: A | Discharge status: Home / Self Care | Payer: Self-pay | Source: Ambulatory Visit | Attending: Cardiology

## 2014-03-22 ENCOUNTER — Ambulatory Visit (HOSPITAL_COMMUNITY)
Admission: RE | Admit: 2014-03-22 | Discharge: 2014-03-23 | Disposition: A | Discharge status: Home / Self Care | Payer: 59 | Source: Ambulatory Visit | Attending: Cardiology | Admitting: Cardiology

## 2014-03-22 ENCOUNTER — Encounter (HOSPITAL_COMMUNITY): Payer: Self-pay | Admitting: General Practice

## 2014-03-22 DIAGNOSIS — Z8249 Family history of ischemic heart disease and other diseases of the circulatory system: Secondary | ICD-10-CM | POA: Diagnosis not present

## 2014-03-22 DIAGNOSIS — I2510 Atherosclerotic heart disease of native coronary artery without angina pectoris: Secondary | ICD-10-CM | POA: Diagnosis present

## 2014-03-22 DIAGNOSIS — I779 Disorder of arteries and arterioles, unspecified: Secondary | ICD-10-CM

## 2014-03-22 DIAGNOSIS — E663 Overweight: Secondary | ICD-10-CM | POA: Diagnosis not present

## 2014-03-22 DIAGNOSIS — I6529 Occlusion and stenosis of unspecified carotid artery: Secondary | ICD-10-CM | POA: Diagnosis not present

## 2014-03-22 DIAGNOSIS — I8390 Asymptomatic varicose veins of unspecified lower extremity: Secondary | ICD-10-CM | POA: Diagnosis not present

## 2014-03-22 DIAGNOSIS — I872 Venous insufficiency (chronic) (peripheral): Secondary | ICD-10-CM | POA: Diagnosis not present

## 2014-03-22 DIAGNOSIS — I10 Essential (primary) hypertension: Secondary | ICD-10-CM | POA: Diagnosis not present

## 2014-03-22 DIAGNOSIS — Z6830 Body mass index (BMI) 30.0-30.9, adult: Secondary | ICD-10-CM | POA: Diagnosis not present

## 2014-03-22 DIAGNOSIS — Z7902 Long term (current) use of antithrombotics/antiplatelets: Secondary | ICD-10-CM | POA: Diagnosis not present

## 2014-03-22 DIAGNOSIS — I739 Peripheral vascular disease, unspecified: Secondary | ICD-10-CM

## 2014-03-22 DIAGNOSIS — Z79899 Other long term (current) drug therapy: Secondary | ICD-10-CM | POA: Insufficient documentation

## 2014-03-22 DIAGNOSIS — Z01810 Encounter for preprocedural cardiovascular examination: Secondary | ICD-10-CM | POA: Insufficient documentation

## 2014-03-22 DIAGNOSIS — E785 Hyperlipidemia, unspecified: Secondary | ICD-10-CM

## 2014-03-22 DIAGNOSIS — F17200 Nicotine dependence, unspecified, uncomplicated: Secondary | ICD-10-CM | POA: Diagnosis not present

## 2014-03-22 DIAGNOSIS — R9439 Abnormal result of other cardiovascular function study: Secondary | ICD-10-CM | POA: Diagnosis present

## 2014-03-22 HISTORY — DX: Occlusion and stenosis of unspecified vertebral artery: I65.09

## 2014-03-22 HISTORY — DX: Concussion with loss of consciousness status unknown, initial encounter: S06.0XAA

## 2014-03-22 HISTORY — PX: CARDIAC CATHETERIZATION: SHX172

## 2014-03-22 HISTORY — DX: Chronic pulmonary edema: J81.1

## 2014-03-22 HISTORY — DX: Concussion with loss of consciousness of unspecified duration, initial encounter: S06.0X9A

## 2014-03-22 HISTORY — DX: Pure hypercholesterolemia, unspecified: E78.00

## 2014-03-22 HISTORY — PX: LEFT HEART CATHETERIZATION WITH CORONARY ANGIOGRAM: SHX5451

## 2014-03-22 SURGERY — LEFT HEART CATHETERIZATION WITH CORONARY ANGIOGRAM
Anesthesia: LOCAL

## 2014-03-22 MED ORDER — HEPARIN (PORCINE) IN NACL 2-0.9 UNIT/ML-% IJ SOLN
INTRAMUSCULAR | Status: AC
  Filled 2014-03-22: qty 1000

## 2014-03-22 MED ORDER — LISINOPRIL 40 MG PO TABS
40.0000 mg | ORAL_TABLET | Freq: Every day | ORAL | Status: DC
  Filled 2014-03-22: qty 1

## 2014-03-22 MED ORDER — ADULT MULTIVITAMIN W/MINERALS CH
1.0000 | ORAL_TABLET | Freq: Every morning | ORAL | Status: DC
  Filled 2014-03-22: qty 1

## 2014-03-22 MED ORDER — ONDANSETRON HCL 4 MG/2ML IJ SOLN: 4.0000 mg | Freq: Four times a day (QID) | INTRAMUSCULAR | Status: DC | PRN

## 2014-03-22 MED ORDER — SODIUM CHLORIDE 0.9 % IJ SOLN: 3.0000 mL | Freq: Two times a day (BID) | INTRAMUSCULAR | Status: DC

## 2014-03-22 MED ORDER — SODIUM CHLORIDE 0.9 % IV SOLN
1.0000 mL/kg/h | INTRAVENOUS | Status: AC
  Administered 2014-03-22: 1 mL/kg/h via INTRAVENOUS

## 2014-03-22 MED ORDER — LABETALOL HCL 5 MG/ML IV SOLN
INTRAVENOUS | Status: AC
  Filled 2014-03-22: qty 4

## 2014-03-22 MED ORDER — SODIUM CHLORIDE 0.9 % IV SOLN: 1.0000 mL/kg/h | INTRAVENOUS | Status: DC

## 2014-03-22 MED ORDER — SODIUM CHLORIDE 0.9 % IV SOLN
INTRAVENOUS | Status: DC
  Administered 2014-03-22: 12:00:00 via INTRAVENOUS

## 2014-03-22 MED ORDER — SODIUM CHLORIDE 0.9 % IJ SOLN: 3.0000 mL | INTRAMUSCULAR | Status: DC | PRN

## 2014-03-22 MED ORDER — HYDROCHLOROTHIAZIDE 25 MG PO TABS
25.0000 mg | ORAL_TABLET | Freq: Every day | ORAL | Status: DC
  Filled 2014-03-22: qty 1

## 2014-03-22 MED ORDER — ASPIRIN 81 MG PO CHEW
CHEWABLE_TABLET | ORAL | Status: AC
  Administered 2014-03-22: 12:00:00
  Filled 2014-03-22: qty 1

## 2014-03-22 MED ORDER — LIDOCAINE HCL (PF) 1 % IJ SOLN
INTRAMUSCULAR | Status: AC
  Filled 2014-03-22: qty 30

## 2014-03-22 MED ORDER — VITAMIN D3 25 MCG (1000 UNIT) PO TABS
1000.0000 [IU] | ORAL_TABLET | Freq: Every day | ORAL | Status: DC
  Filled 2014-03-22: qty 1

## 2014-03-22 MED ORDER — ASPIRIN 81 MG PO CHEW: 81.0000 mg | CHEWABLE_TABLET | ORAL | Status: DC

## 2014-03-22 MED ORDER — CLOPIDOGREL BISULFATE 75 MG PO TABS
75.0000 mg | ORAL_TABLET | Freq: Every day | ORAL | Status: DC
  Administered 2014-03-23: 75 mg via ORAL
  Filled 2014-03-22: qty 1

## 2014-03-22 MED ORDER — NITROGLYCERIN 1 MG/10 ML FOR IR/CATH LAB
INTRA_ARTERIAL | Status: AC
  Filled 2014-03-22: qty 10

## 2014-03-22 MED ORDER — ACETAMINOPHEN 325 MG PO TABS: 650.0000 mg | ORAL_TABLET | ORAL | Status: DC | PRN

## 2014-03-22 MED ORDER — SODIUM CHLORIDE 0.9 % IV SOLN: 250.0000 mL | INTRAVENOUS | Status: DC | PRN

## 2014-03-22 NOTE — H&P (View-Only) (Signed)
Arthur Hines, Arthur Hines    Date of visit:  03/19/2014 DOB:  04-Apr-1951    Age:  62 yrs. Medical record number:  [id number]     Account number:  [id number] Primary Care Provider: Ralene OkMOREIRA, ROY ____________________________ CURRENT DIAGNOSES  1. Carotid Artery Stenosis  2. Pre-Op Cardiovascular Exam  3. Hyperlipidemia  4. Hypertension,Essential (Benign)  5. CAD (Native coronary artery)  6. Obesity(BMI30-40)  7. Chronic venous insufficiency ____________________________ ALLERGIES  Crestor, Muscle aches ____________________________ MEDICATIONS  1. clopidogrel 75 mg tablet, 1 p.o. daily  2. hydrochlorothiazide 25 mg tablet, 1 p.o. daily  3. lisinopril 40 mg tablet, 1 p.o. daily  4. Fish Oil 340 mg-1,000 mg capsule, 2   1 qpm  5. multivitamin tablet, 1 p.o. daily  6. vit E-tocotrienol gamma, alpha, delta 50 unit-50 mg (47mg -2mg -1mg ) cap, 2 qd ____________________________ CHIEF COMPLAINTS Preop cardiac eval ____________________________ HISTORY OF PRESENT ILLNESS This 63 year old male is seen for precath evaluation. He was initially seen for opinion regarding management of chronic vascular disease. He has a history of hypertension, obesity and hyperlipidemia. He had some vertigo and dizziness and was admitted to the hospital and was found to have carotid artery disease with a 40% stenosis in the right carotid artery. He remains overweight. He had been recommended to take statin therapy but had not been taking it because he wanted to try natural therapy. He is able to do walking and exercise without angina and has no cardiac symptoms. He has a history of chronic edema due to venous insufficiency. As part of his workup he had a calcium score that was greater than 1400 consistently had a myocardial perfusion scan that showed a fairly large posterolateral ischemic partially reversible defect. He had relatively good exercise tolerance with 2 mm of ST depression at peak exercise. Catheterization is advised to  assess the degree of cardiac stenosis. ____________________________ PAST HISTORY  Past Medical Illnesses:  hypertension, hyperlipidemia, varicose veins, venous insufficiency;  Cardiovascular Illnesses:  CAD, Carotid artery disease;  Infectious Diseases:  no previous history of significant infectious diseases;  Surgical Procedures:  hernia repair, hand surgery;  Trauma History:  no previous history of significant trauma;  NYHA Classification:  I;  Cardiology Procedures-Invasive:  no previous interventional or invasive cardiology procedures;  Cardiology Procedures-Noninvasive:  treadmill Myoview August 2015;  Peripheral Vascular Procedures:  Carotid doppler;  LVEF of 65% documented via nuclear study on 03/14/2014,   ____________________________ FAMILY HISTORY Father -- Father dead, Brain disorder Mother -- Mother dead, Death due to natural cause Sister -- Brother alive and well ____________________________ SOCIAL HISTORY Alcohol Use:  does not use alcohol;  Smoking:  smokes a pipe occasionally;  Diet:  regular diet;  Lifestyle:  divorced and 1 daughter;  Exercise:  exercises regularly;   ____________________________ REVIEW OF SYSTEMS General:  denies recent weight change, fatique or change in exercise tolerance. Eyes: wears eye glasses/contact lenses Ears, Nose, Throat, Mouth:  denies any hearing loss, epistaxis, hoarseness or difficulty speaking. Respiratory: denies dyspnea, cough, wheezing or hemoptysis. Cardiovascular:  please review HPI Abdominal: denies dyspepsia, GI bleeding, constipation, or diarrhea Genitourinary-Male: erectile dysfunction  Musculoskeletal:  chronic varicose veins and some edema Neurological:  peripheral neuropathy Psychiatric:  denies depession or anxiety  ____________________________ PHYSICAL EXAMINATION VITAL SIGNS  Blood Pressure:  152/70 Sitting, Left arm, large cuff  , 148/74 Standing, Left arm and large cuff   Pulse:  70/min. Weight:  226.00 lbs. Height:  72"BMI:  30  Constitutional:  pleasant white male in no acute distress Skin:  warm and dry to touch, no apparent skin lesions, or masses noted. Head:  normocephalic, normal hair pattern, no masses or tenderness Eyes:  EOMS Intact, PERRLA, C and S clear, Funduscopic exam not done. ENT:  ears, nose and throat reveal no gross abnormalities.  Dentition good. Neck:  no JVD, no masses, non-tender, right carotid bruit present Chest:  normal symmetry, clear to auscultation. Cardiac:  regular rhythm, normal S1 and S2, No S3 or S4, no murmurs, gallops or rubs detected. Abdomen:  abdomen soft,non-tender, no masses, no hepatospenomegaly, or aneurysm noted Peripheral Pulses:  the femoral,dorsalis pedis, and posterior tibial pulses are full and equal bilaterally with no bruits auscultated. Extremities & Back:  1+ edema, bilateral venous insufficiency changes present Neurological:  no gross motor or sensory deficits noted, affect appropriate, oriented x3. ____________________________ MOST RECENT LIPID PANEL 03/05/14  CHOL TOTL 236 mg/dl, LDL 161 NM, HDL 50 mg/dl, TRIGLYCER 096 mg/dl and CHOL/HDL 4.7 (Calc) ____________________________ IMPRESSIONS/PLAN  1. Coronary artery disease with fibrous calcium score and moderate risk myocardial perfusion scan 2. Hyperlipidemia was recently started treatment 3. Hypertension 4. Overweight  Recommendations:  Admit for same-day catheterization. Cardiac catheterization was discussed with the patient including risks of myocardial infarction, death, stroke, bleeding, arrhythmia, dye allergy, or renal insufficiency. He understands and is willing to proceed. Possibility of percutaneous intervention at the same setting was also discussed with the patient including risks. ____________________________ TODAYS ORDERS  1. Comprehensive Metabolic Panel: Today  2. Complete Blood Count: Today  3. Draw PT/INR: Today  4. PTT: Today                        ____________________________ Cardiology Physician:  Darden Palmer MD [facility]

## 2014-03-22 NOTE — Discharge Summary (Signed)
Physician Discharge Summary  Patient ID: Coolidge Breezeerry Mccown MRN: [id number] DOB/AGE: 02/28/1951 63 y.o.  Admit date: 03/22/2014 Discharge date: 03/22/2014  Primary Physician:  Dr. Ralene Okoy Moreira  Primary Discharge Diagnosis:  1. Coronary artery disease  Secondary Discharge Diagnosis: 2. Hypertension 3. Hyperlipidemia 4. Varicose venous disease  Procedures:  Cardiac Catheterization  Hospital Course: The patient was brought in for an outpatient catheterization. He was found to have moderately severe stenosis in the mid LAD in 2 spots and an occluded posterolateral branch of the right coronary artery with collaterals from the left coronary system. There was also a 95% stenosis and a second posterolateral branch. After review of the films with the interventional cardiologist medical treatment was recommended. He would've been able to go home the same day but had nobody to stay with them that night he was kept overnight for observation and discharged the next day in stable condition.  Discharge Exam: Blood pressure 126/64, pulse 66, temperature 98.3 F (36.8 C), temperature source Oral, resp. rate 16, height 6\' 2"  (1.88 m), weight 102.513 kg (226 lb), SpO2 97.00%. Weight: 102.513 kg (226 lb) Cath site is stable  Labs: CBC:   Lab Results  Component Value Date   WBC 8.6 09/29/2012   HGB 13.8 09/29/2012   HCT 42.9 09/29/2012   MCV 92.3 09/29/2012   PLT 171 09/29/2012     Lipid Panel     Component Value Date/Time   CHOL 184 09/30/2012 0505   TRIG 106 09/30/2012 0505   HDL 46 09/30/2012 0505   CHOLHDL 4.0 09/30/2012 0505   VLDL 21 09/30/2012 0505   LDLCALC 117* 09/30/2012 0505    Hemoglobin A1C: Lab Results  Component Value Date   HGBA1C 5.8* 09/29/2012   Discharge Medications:   Medication List         cholecalciferol 1000 UNITS tablet  Commonly known as:  VITAMIN D  Take 1,000 Units by mouth daily.     clopidogrel 75 MG tablet  Commonly known as:  PLAVIX  Take 1 tablet (75 mg  total) by mouth daily with breakfast.     fish oil-omega-3 fatty acids 1000 MG capsule  Take 2 g by mouth every morning.     hydrochlorothiazide 25 MG tablet  Commonly known as:  HYDRODIURIL  Take 25 mg by mouth daily.     Lecithin 1200 MG Caps  Take 1,200 mg by mouth 2 (two) times daily.     lisinopril 40 MG tablet  Commonly known as:  PRINIVIL,ZESTRIL  Take 40 mg by mouth daily.     multivitamin with minerals Tabs tablet  Take 1 tablet by mouth every morning. Alive Mens over 50     OVER THE COUNTER MEDICATION  Take 1 tablet by mouth daily. OTC Tocodrienol     OVER THE COUNTER MEDICATION  Take 1 capsule by mouth 2 (two) times a week. Over the counter homeopathic medication-Bioplasm     PYCNOGENOL PO  Take 1 capsule by mouth daily.         Followup plans and appointments: Followup Dr. Donnie Ahoilley in one week    Signed: W. Ashley RoyaltySpencer Anagha Loseke, Jr. MD [facility] 03/22/2014, 8:11 PM

## 2014-03-22 NOTE — Progress Notes (Signed)
Site area: right groin 5 fr sheath Site Prior to Removal:  Level 0 Pressure Applied For: 20 minutes Manual:   yes Patient Status During Pull:  No complications Post Pull Site:  Level 0 Post Pull Instructions Given:  yes Post Pull Pulses Present: pt 2+ Dressing Applied:  tegaderm  Bedrest begins @ 1415 Comments: sipping fluids

## 2014-03-22 NOTE — Interval H&P Note (Signed)
History and Physical Interval Note:  03/22/2014 12:51 PM    Patient seen and examined.  No interval change in history and exam since last note.  Stable for procedure.  Darden PalmerW. Spencer Jaonna Word, Jr. MD [facility]  03/22/2014

## 2014-03-22 NOTE — CV Procedure (Signed)
Cardiac Catheterization Report   Arthur Hines    63 y.o.  male  DOB: 1950-12-26  MRN: [id number]  03/22/2014    PROCEDURE:  Left heart catheterization with selective coronary angiography, left ventriculogram.  INDICATIONS: Significant family history of cardiac disease with high calcium score and ischemic myocardial perfusion scan  The risks, benefits, and details of the procedure were explained to the patient.  The patient verbalized understanding and wanted to proceed.  Informed written consent was obtained.  PROCEDURE TECHNIQUE:  After Xylocaine anesthesia a 67F sheath was placed in the right femoral artery with a single anterior needle wall stick.   Left coronary angiography was done using a Judkins L4 guide catheter.  Right coronary angiography was done using a Judkins R4 guide catheter.  A 30 cc ventriculogram was performed in the 30 degree RAO projection.  Tolerated the procedure well.  Sheath removed in the holding area.   CONTRAST:  Total of  80 cc.  COMPLICATIONS:  None.    HEMODYNAMICS:  Aortic postcontrast 170/75, left ventricle postcontrast 170/5-14.  There was no gradient between the left ventricle and aorta.    ANGIOGRAPHIC DATA:    CORONARY ARTERIES:   Arise and distribute normally. Right dominant. Mild coronary calcification is noted.  Left main coronary artery: Mild distal narrowing  Left anterior descending: Mild calcified proximally. There are tandem 60-70% stenosis in the mid LAD after 2 diagonal branches  Circumflex coronary artery: Scattered irregularities.  Right coronary artery: Right dominant vessel, there is a twin distal posterolateral branch that is occluded and fills by collaterals from left coronary system. There is another posterior lateral branch has a severe 95% stenosis and there appeared to be ghosting of some of the branches of this vessel also.Marland Kitchen.  LEFT VENTRICULOGRAM:  Performed in the 30 RAO projection.   The aortic and mitral valves are normal. The left ventricle is normal in size with normal wall motion. Estimated ejection fraction is 65%  IMPRESSIONS:  1. 2 vessel coronary artery disease with tandem moderately severe mid LAD stenosis, occlusion of the distal posterolateral branch and severe stenosis in a second posterolateral branch 2. Normal left ventricle.Marland Kitchen.  RECOMMENDATION:  Catheterization films were reviewed with Dr. Sanjuana KavaMcAlhaney. In light of his good exercise capacity and occlusion of the posterior lateral branch, plan additional medical therapy with intensive risk factor modification with hypertension control and lipid management.  Darden PalmerW. Spencer Xavier Fournier, Jr. MD [facility]

## 2014-03-22 NOTE — Care Management Note (Addendum)
  Page 1 of 1   03/22/2014     4:34:40 PM CARE MANAGEMENT NOTE 03/22/2014  Patient:  Arthur Hines,Arthur Hines   Account Number:  [id number]  Date Initiated:  03/22/2014  Documentation initiated by:  Donato SchultzHUTCHINSON,Hyman Crossan  Subjective/Objective Assessment:   CAD     Action/Plan:   CM to follow for disposition needs   Anticipated DC Date:  03/23/2014   Anticipated DC Plan:  HOME/SELF CARE         Choice offered to / List presented to:             Status of service:  Completed, signed off Medicare Important Message given?   (If response is "NO", the following Medicare IM given date fields will be blank) Date Medicare IM given:   Medicare IM given by:   Date Additional Medicare IM given:   Additional Medicare IM given by:    Discharge Disposition:  HOME/SELF CARE  Per UR Regulation:    If discussed at Long Length of Stay Meetings, dates discussed:    Comments:  Rulon Abdalla RN, BSN, MSHL, CCM  Nurse - Case Manager, (Unit [phone])  [phone]  03/22/2014 ADM: CAD and Left heart cath Med Review: Plavix Disposition Plan:  Home / self care.

## 2014-03-22 NOTE — Progress Notes (Signed)
Patient stable following catheterization. He was kept overnight because he did not have anybody stay with them after catheterization. I discussed the catheterization results with him and have strongly recommended a statin drug for him to take. He has Lipitor at home. He will go home in the morning if he is stable overnight.  Darden PalmerW. Spencer Rashidi Loh, Jr. MD [facility]

## 2014-03-23 DIAGNOSIS — I2510 Atherosclerotic heart disease of native coronary artery without angina pectoris: Secondary | ICD-10-CM | POA: Diagnosis not present

## 2014-07-18 ENCOUNTER — Encounter (HOSPITAL_COMMUNITY): Payer: Self-pay | Admitting: Cardiology

## 2015-03-01 IMAGING — CT CT HEART SCORING
1 of 2 series · 11 of 20 positions shown, 14 images · non-contrast
Comparison: None.

CLINICAL DATA: History of carotid stenosis.

EXAM:
CT HEART FOR CALCIUM SCORING
TECHNIQUE: CT heart was performed on a 64 channel system using prospective ECG
gating. A scout and noncontrast exam (for calcium scoring) were
performed. Note that this exam targets the heart and the chest was
not imaged in its entirety.

[Series 304: standard 2.5 · axial · 0.49mm/px · z∈[-288,-173]mm · 11 of 56 slices shown, 14 images]
[im 5/56  vessel]
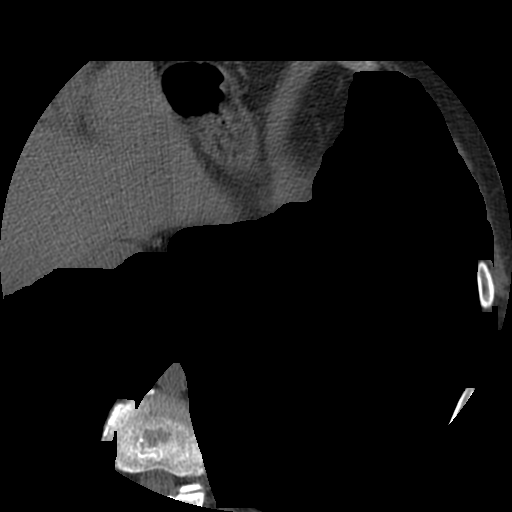
[im 5/56  lung]
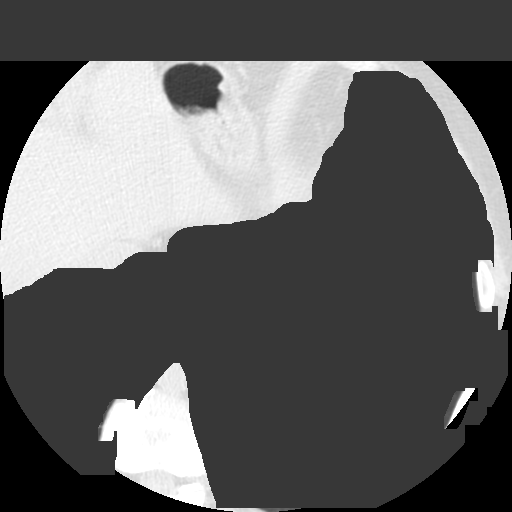
[im 10/56  vessel]
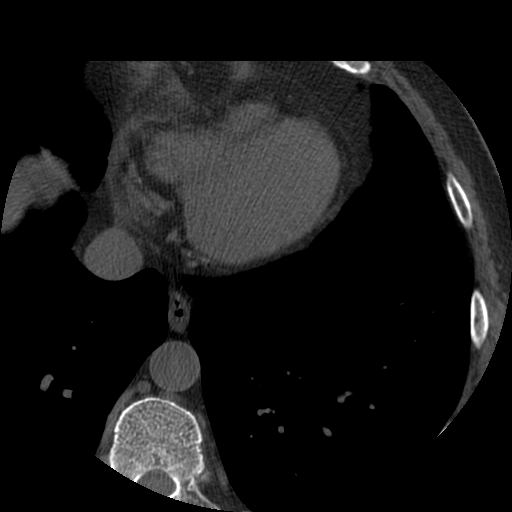
[im 14/56  vessel]
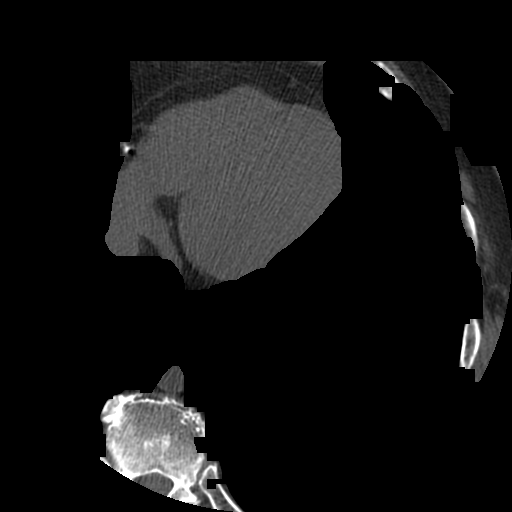
[im 19/56  vessel]
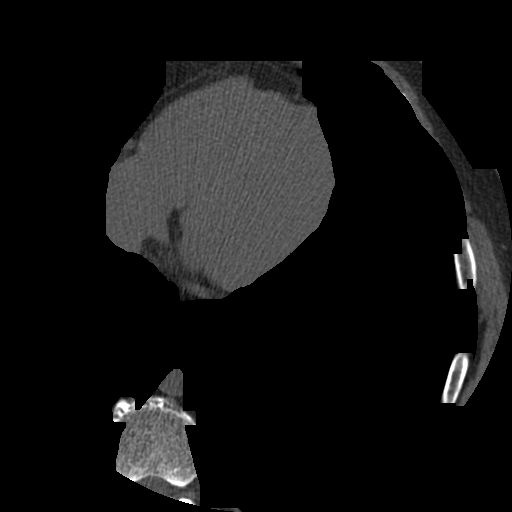
[im 23/56  vessel]
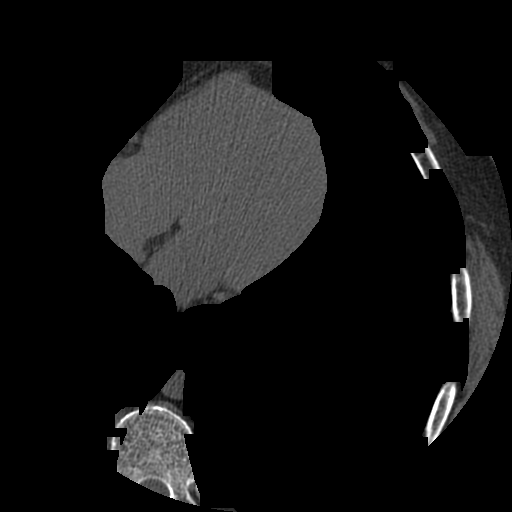
[im 23/56  lung]
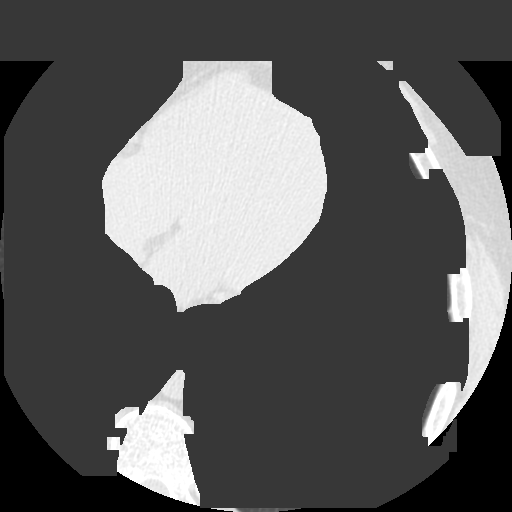
[im 28/56  vessel]
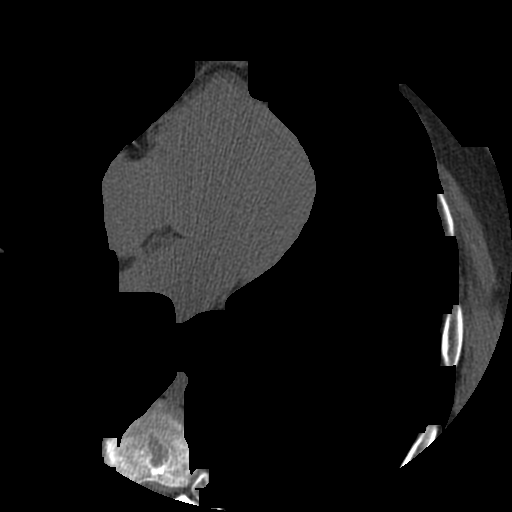
[im 33/56  vessel]
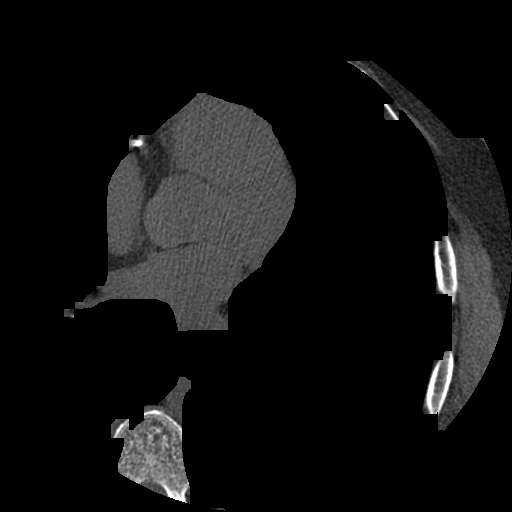
[im 37/56  vessel]
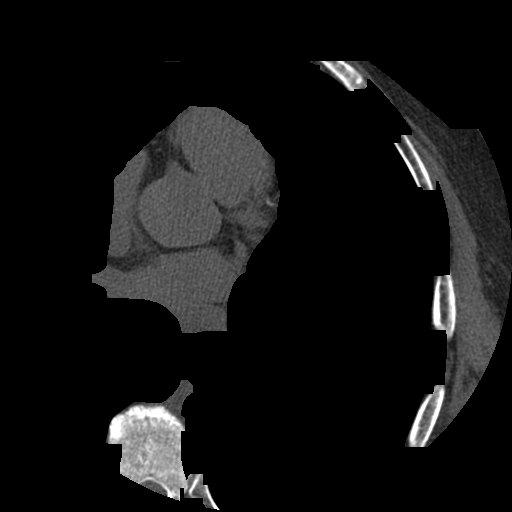
[im 42/56  vessel]
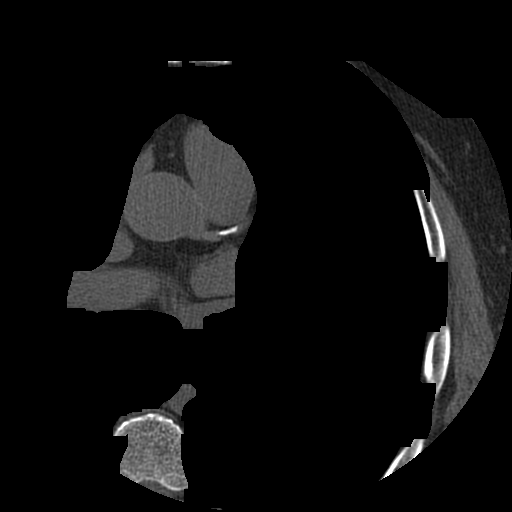
[im 42/56  lung]
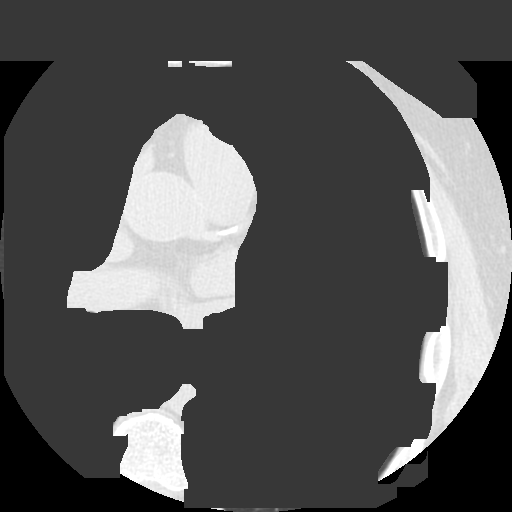
[im 46/56  vessel]
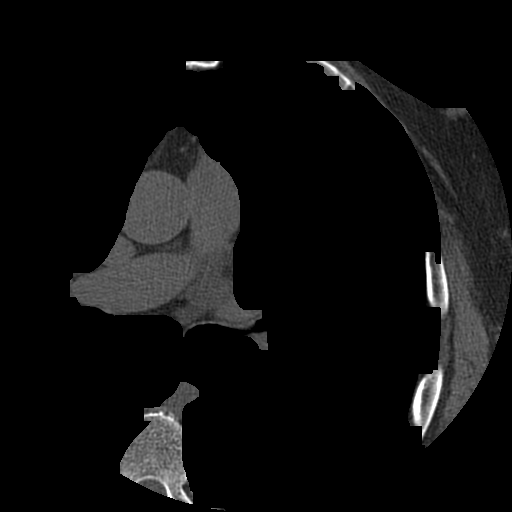
[im 51/56  vessel]
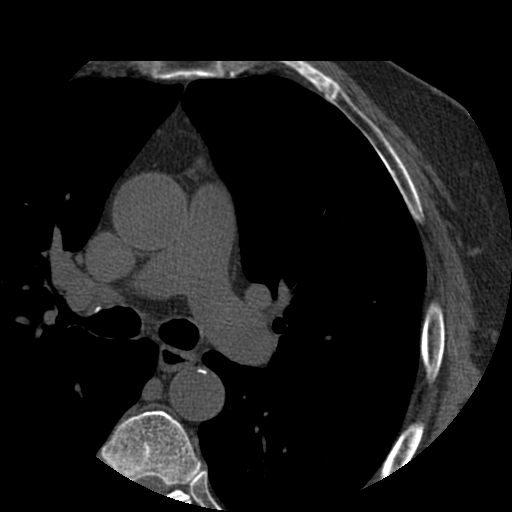

[11 of 20 positions shown; findings below may reference images not displayed]

FINDINGS: Technical quality: Excellent.

CORONARY CALCIUM

Left main coronary artery:  141

Left anterior descending:   366

Left circumflex:  29

Right coronary artery:  944

Total Agatston Score: 1,480

[HOSPITAL] percentile:  97th

EXTRACARDIAC FINDINGS:

4 mm left lower lobe nodule (image 15 of series 302) and 3 mm left
lower lobe nodule (image 22 of series 302). Within the visualized
portions of the thorax there is no lymphadenopathy, pneumothorax, no
consolidative airspace disease and no pleural effusions. Visualized
portions of the upper abdomen are unremarkable. There are no
aggressive appearing lytic or blastic lesions noted in the
visualized portions of the skeleton.
IMPRESSION: 1. The patient's total coronary artery calcium score is [DATE], which
is 97th percentile for patient's of matched age, gender and
race/ethnicity.
2. Two tiny pulmonary nodules in the left lower lobe, largest of
which measures only 4 mm. If the patient is at high risk for
bronchogenic carcinoma, follow-up chest CT at 1 year is recommended.
If the patient is at low risk, no follow-up is needed. This
recommendation follows the consensus statement: Guidelines for
Management of Small Pulmonary Nodules Detected on CT Scans: A
Statement from the [HOSPITAL] as published in Radiology
